# Patient Record
Sex: Female | Born: 1960 | Race: White | Hispanic: No | Smoking: Former smoker
Health system: Southern US, Community
[De-identification: ages and names within clinical notes are randomized; demographics above are authoritative.]

## PROBLEM LIST (undated history)

## (undated) DIAGNOSIS — R011 Cardiac murmur, unspecified: Secondary | ICD-10-CM

## (undated) DIAGNOSIS — G709 Myoneural disorder, unspecified: Secondary | ICD-10-CM

## (undated) DIAGNOSIS — M1990 Unspecified osteoarthritis, unspecified site: Secondary | ICD-10-CM

## (undated) HISTORY — DX: Unspecified osteoarthritis, unspecified site: M19.90

---

## 2000-04-22 ENCOUNTER — Encounter: Payer: Self-pay | Admitting: Family Medicine

## 2000-04-22 ENCOUNTER — Encounter: Admission: RE | Admit: 2000-04-22 | Discharge: 2000-04-22 | Payer: Self-pay | Admitting: Family Medicine

## 2000-05-01 ENCOUNTER — Encounter: Admission: RE | Admit: 2000-05-01 | Discharge: 2000-05-01 | Payer: Self-pay | Admitting: Family Medicine

## 2000-05-01 ENCOUNTER — Encounter: Payer: Self-pay | Admitting: Family Medicine

## 2000-05-14 ENCOUNTER — Ambulatory Visit (HOSPITAL_BASED_OUTPATIENT_CLINIC_OR_DEPARTMENT_OTHER): Admission: RE | Admit: 2000-05-14 | Discharge: 2000-05-14 | Payer: Self-pay | Admitting: General Surgery

## 2000-05-14 ENCOUNTER — Encounter: Payer: Self-pay | Admitting: General Surgery

## 2000-09-23 HISTORY — PX: OTHER SURGICAL HISTORY: SHX169

## 2001-07-13 ENCOUNTER — Encounter: Payer: Self-pay | Admitting: Family Medicine

## 2001-07-13 ENCOUNTER — Encounter: Admission: RE | Admit: 2001-07-13 | Discharge: 2001-07-13 | Payer: Self-pay | Admitting: Family Medicine

## 2003-04-04 ENCOUNTER — Encounter: Payer: Self-pay | Admitting: Family Medicine

## 2003-04-04 ENCOUNTER — Encounter: Admission: RE | Admit: 2003-04-04 | Discharge: 2003-04-04 | Payer: Self-pay | Admitting: Family Medicine

## 2018-08-18 DIAGNOSIS — B029 Zoster without complications: Secondary | ICD-10-CM | POA: Diagnosis not present

## 2020-11-24 LAB — COLOGUARD: COLOGUARD: NEGATIVE

## 2020-11-28 ENCOUNTER — Other Ambulatory Visit: Payer: Self-pay | Admitting: Internal Medicine

## 2020-11-28 DIAGNOSIS — Z1231 Encounter for screening mammogram for malignant neoplasm of breast: Secondary | ICD-10-CM

## 2021-01-23 ENCOUNTER — Ambulatory Visit
Admission: RE | Admit: 2021-01-23 | Discharge: 2021-01-23 | Disposition: A | Discharge status: Home / Self Care | Payer: Commercial Managed Care - PPO | Source: Ambulatory Visit | Attending: Internal Medicine | Admitting: Internal Medicine

## 2021-01-23 ENCOUNTER — Other Ambulatory Visit: Payer: Self-pay

## 2021-01-23 DIAGNOSIS — Z1231 Encounter for screening mammogram for malignant neoplasm of breast: Secondary | ICD-10-CM

## 2021-01-24 ENCOUNTER — Other Ambulatory Visit: Payer: Self-pay | Admitting: Internal Medicine

## 2021-01-24 DIAGNOSIS — R928 Other abnormal and inconclusive findings on diagnostic imaging of breast: Secondary | ICD-10-CM

## 2021-02-15 ENCOUNTER — Other Ambulatory Visit: Payer: Self-pay | Admitting: Internal Medicine

## 2021-02-15 ENCOUNTER — Ambulatory Visit
Admission: RE | Admit: 2021-02-15 | Discharge: 2021-02-15 | Disposition: A | Discharge status: Home / Self Care | Payer: Commercial Managed Care - PPO | Source: Ambulatory Visit | Attending: Internal Medicine | Admitting: Internal Medicine

## 2021-02-15 ENCOUNTER — Other Ambulatory Visit: Payer: Self-pay

## 2021-02-15 DIAGNOSIS — R928 Other abnormal and inconclusive findings on diagnostic imaging of breast: Secondary | ICD-10-CM

## 2021-02-20 ENCOUNTER — Encounter (HOSPITAL_COMMUNITY): Payer: Self-pay | Admitting: Emergency Medicine

## 2021-02-20 ENCOUNTER — Ambulatory Visit (HOSPITAL_COMMUNITY)
Admission: EM | Admit: 2021-02-20 | Discharge: 2021-02-20 | Disposition: A | Discharge status: Home / Self Care | Payer: Commercial Managed Care - PPO | Attending: Family Medicine | Admitting: Family Medicine

## 2021-02-20 ENCOUNTER — Other Ambulatory Visit: Payer: Self-pay

## 2021-02-20 DIAGNOSIS — Z20822 Contact with and (suspected) exposure to covid-19: Secondary | ICD-10-CM | POA: Diagnosis present

## 2021-02-20 DIAGNOSIS — R509 Fever, unspecified: Secondary | ICD-10-CM | POA: Diagnosis present

## 2021-02-20 DIAGNOSIS — R519 Headache, unspecified: Secondary | ICD-10-CM | POA: Diagnosis present

## 2021-02-20 NOTE — Discharge Instructions (Signed)
You have been tested for COVID-19 today. °If your test returns positive, you will receive a phone call from [facility] regarding your results. °Negative test results are not called. °Both positive and negative results area always visible on MyChart. °If you do not have a MyChart account, sign up instructions are provided in your discharge papers. °Please do not hesitate to contact us should you have questions or concerns. ° °

## 2021-02-20 NOTE — ED Triage Notes (Signed)
Pt presents with headache and fever that started this am after exposure.   States taking tylenol and OTC flu medication.

## 2021-02-21 LAB — SARS CORONAVIRUS 2 (TAT 6-24 HRS): SARS Coronavirus 2: POSITIVE — AB

## 2021-02-21 NOTE — ED Provider Notes (Signed)
  [facility]   [id number] 02/20/21 Arrival Time: 1819  ASSESSMENT & PLAN:  1. Acute nonintractable headache, unspecified headache type   2. Subjective fever   3. Exposure to COVID-19 virus     COVID-19 testing sent. OTC symptom care as needed.  No orders of the defined types were placed in this encounter.    Follow-up Information    Tannenbaum, [place], MD.   Specialty: Internal Medicine Why: As needed. Contact information: [address] [place] [address] [phone]               Reviewed expectations re: course of current medical issues. Questions answered. Outlined signs and symptoms indicating need for more acute intervention. Understanding verbalized. After Visit Summary given.   SUBJECTIVE: History from: patient. KEYSHA DAMEWOOD is a 60 y.o. female who presents with worries regarding COVID-19. Known COVID-19 contact: reports recent exposure. Recent travel: none. Reports: HA with subj fever; today. Denies: difficulty breathing. Normal PO intake without n/v/d.    OBJECTIVE:  Vitals:   02/20/21 1911  BP: 128/60  Pulse: 86  Resp: 15  Temp: 98.7 F (37.1 C)  TempSrc: Oral  SpO2: 98%    General appearance: alert; no distress Eyes: PERRLA; EOMI; conjunctiva normal HENT: [place]; AT; without nasal congestion Neck: supple  Lungs: speaks full sentences without difficulty; unlabored Extremities: no edema Skin: warm and dry Neurologic: normal gait Psychological: alert and cooperative; normal mood and affect   Allergies  Allergen Reactions  . Penicillin G Anaphylaxis  . Aspirin Other (See Comments)  . Prednisone Other (See Comments)    History reviewed. No pertinent past medical history. Social History   Socioeconomic History  . Marital status: Divorced    Spouse name: Not on file  . Number of children: Not on file  . Years of education: Not on file  . Highest education level: Not on file  Occupational History  . Not  on file  Tobacco Use  . Smoking status: Never Smoker  . Smokeless tobacco: Never Used  Substance and Sexual Activity  . Alcohol use: Never  . Drug use: Not on file  . Sexual activity: Not on file  Other Topics Concern  . Not on file  Social History Narrative  . Not on file   Social Determinants of Health   Financial Resource Strain: Not on file  Food Insecurity: Not on file  Transportation Needs: Not on file  Physical Activity: Not on file  Stress: Not on file  Social Connections: Not on file  Intimate Partner Violence: Not on file   History reviewed. No pertinent family history. History reviewed. No pertinent surgical history.   Mardella Layman, MD 02/21/21 1030

## 2021-05-21 ENCOUNTER — Other Ambulatory Visit: Payer: Self-pay | Admitting: Internal Medicine

## 2021-05-21 ENCOUNTER — Ambulatory Visit
Admission: RE | Admit: 2021-05-21 | Discharge: 2021-05-21 | Disposition: A | Discharge status: Home / Self Care | Payer: Commercial Managed Care - PPO | Source: Ambulatory Visit | Attending: Internal Medicine | Admitting: Internal Medicine

## 2021-05-21 DIAGNOSIS — M6281 Muscle weakness (generalized): Secondary | ICD-10-CM

## 2021-08-15 ENCOUNTER — Ambulatory Visit (INDEPENDENT_AMBULATORY_CARE_PROVIDER_SITE_OTHER): Payer: Commercial Managed Care - PPO | Admitting: Neurology

## 2021-08-15 ENCOUNTER — Encounter: Payer: Self-pay | Admitting: Neurology

## 2021-08-15 VITALS — BP 127/75 | HR 75 | Ht 63.0 in | Wt 153.0 lb

## 2021-08-15 DIAGNOSIS — M5416 Radiculopathy, lumbar region: Secondary | ICD-10-CM | POA: Diagnosis not present

## 2021-08-15 DIAGNOSIS — M21371 Foot drop, right foot: Secondary | ICD-10-CM | POA: Diagnosis not present

## 2021-08-15 NOTE — Patient Instructions (Signed)
Nerve conduction study  Referral to physical therapy  Return in 6 months

## 2021-08-15 NOTE — Progress Notes (Signed)
[facility]  PATIENT: Shawna Suarez DOB: 22-Apr-1961  REQUESTING CLINICIAN: Estill Bamberg, MD HISTORY FROM: Patient  REASON FOR VISIT: Right leg pain and right foot drop    HISTORICAL  CHIEF COMPLAINT:  Chief Complaint  Patient presents with   New Patient (Initial Visit)    Rm 12. Alone. NP c/o right leg weakness and shooting pain down back and right leg. C/o numbness in the bottom of right foot. Pt states she is having balance problems.     HISTORY OF PRESENT ILLNESS:  This is a 60 year old woman with no reported past medical history who is presenting with complaint of right lower extremity pain, described as shooting pain, and right foot weakness.  Patient said the symptoms started since last September after receiving the second dose of COVID.  Since that she has been having intermittent pain.  She followed with orthopedist, had a lumbar MRI which showed no abnormality other than degenerative disc disease.  She is able to ambulate but stated she has to be very careful, denies any falls.  She reported the pain is located right at the hip joint and radiates behind right leg all the way down to the foot, there is also report of numbness on the bottom of the foot.  Denies back pain.    OTHER MEDICAL CONDITIONS: None    REVIEW OF SYSTEMS: Full 14 system review of systems performed and negative with exception of: noted in the HPI   ALLERGIES: Allergies  Allergen Reactions   Penicillin G Anaphylaxis   Aspirin Other (See Comments)   Prednisone Other (See Comments)   Sulfa Antibiotics Hives    HOME MEDICATIONS: Outpatient Medications Prior to Visit  Medication Sig Dispense Refill   acetaminophen (TYLENOL) 325 MG tablet Take 650 mg by mouth every 6 (six) hours as needed.     No facility-administered medications prior to visit.    PAST MEDICAL HISTORY: Past Medical History:  Diagnosis Date   Arthritis     PAST SURGICAL HISTORY: Past Surgical  History:  Procedure Laterality Date   right breast biopsy  2002    FAMILY HISTORY: Family History  Problem Relation Age of Onset   Lung cancer Mother    Cervical cancer Mother    Throat cancer Father    Prostate cancer Father     SOCIAL HISTORY: Social History   Socioeconomic History   Marital status: Divorced    Spouse name: Not on file   Number of children: Not on file   Years of education: Not on file   Highest education level: Not on file  Occupational History   Not on file  Tobacco Use   Smoking status: Former    Years: 5.00    Types: Cigarettes    Quit date: 1978    Years since quitting: 44.9   Smokeless tobacco: Never  Substance and Sexual Activity   Alcohol use: Never   Drug use: Not on file   Sexual activity: Not on file  Other Topics Concern   Not on file  Social History Narrative   Not on file   Social Determinants of Health   Financial Resource Strain: Not on file  Food Insecurity: Not on file  Transportation Needs: Not on file  Physical Activity: Not on file  Stress: Not on file  Social Connections: Not on file  Intimate Partner Violence: Not on file    PHYSICAL EXAM  GENERAL EXAM/CONSTITUTIONAL: Vitals:  Vitals:   08/15/21 1456  BP: 127/75  Pulse: 75  Weight: 153 lb (69.4 kg)  Height: 5\' 3"  (1.6 m)   Body mass index is 27.1 kg/m. Wt Readings from Last 3 Encounters:  08/15/21 153 lb (69.4 kg)   Patient is in no distress; well developed, nourished and groomed; neck is supple  CARDIOVASCULAR: Examination of carotid arteries is normal; no carotid bruits Regular rate and rhythm, no murmurs Examination of peripheral vascular system by observation and palpation is normal  EYES: Pupils round and reactive to light, Visual fields full to confrontation, Extraocular movements intacts,   MUSCULOSKELETAL: Gait, strength, tone, movements noted in Neurologic exam below  NEUROLOGIC: MENTAL STATUS:  No flowsheet data found. awake, alert,  oriented to person, place and time recent and remote memory intact normal attention and concentration language fluent, comprehension intact, naming intact fund of knowledge appropriate  CRANIAL NERVE:  2nd, 3rd, 4th, 6th - pupils equal and reactive to light, visual fields full to confrontation, extraocular muscles intact, no nystagmus 5th - facial sensation symmetric 7th - facial strength symmetric 8th - hearing intact 9th - palate elevates symmetrically, uvula midline 11th - shoulder shrug symmetric 12th - tongue protrusion midline  MOTOR:  normal bulk and tone, full strength in the BUE, BLE. There is decrease strength about 4/5 for right foot dorsiflexion and inversion when compared to the left.   SENSORY:  normal and symmetric to light touch, pinprick, and vibration  COORDINATION:  finger-nose-finger, fine finger movements normal  REFLEXES:  deep tendon reflexes present and symmetric  GAIT/STATION:  Antalgic gait      DIAGNOSTIC DATA (LABS, IMAGING, TESTING) - I reviewed patient records, labs, notes, testing and imaging myself where available.  No results found for: WBC, HGB, HCT, MCV, PLT No results found for: NA, K, CL, CO2, GLUCOSE, BUN, CREATININE, CALCIUM, PROT, ALBUMIN, AST, ALT, ALKPHOS, BILITOT, GFRNONAA, GFRAA No results found for: CHOL, HDL, LDLCALC, LDLDIRECT, TRIG, CHOLHDL No results found for: 08/17/21 No results found for: VITAMINB12 No results found for: TSH  MRI Lumbar spine 1.  Mild multilevel lumbar spondylosis with mild to moderate right-sided foraminal stenosis at L3-L4 and L4-L5. 2.  Mild bilateral subarticular recess stenosis at L2-L3, L3 on 4 and L4-5.  No high-grade canal stenosis.    ASSESSMENT AND PLAN  60 y.o. year old female with no reported past medical history who is presenting with complaint of right lower extremity pain, radiating pain down the right leg, right foot drop and right foot numbness.  Patient did have an MRI of lumbar  spine showing mild to moderate foraminal stenosis but no nerve compression.  At this point the neck step is to order a nerve conduction study to look at the integrity of the nerves.  If the nerve conduction study is unrevealing, another consideration is right hip joint arthritis that is causing the pain.  I will also send her to physical therapy for management and treatment of the right foot drop.  I will contact her after completion of nerve studies otherwise I will see her in 6 months for follow-up.  I also offered her pain medication for which she kindly declined.   1. Lumbar radiculopathy   2. Right foot drop      PLAN: Nerve conduction study  Referral to physical therapy  Return in 6 months   Orders Placed This Encounter  Procedures   Ambulatory referral to Physical Therapy   NCV with EMG(electromyography)    No orders of the defined types were placed in this encounter.  Return in about 6 months (around 02/12/2022).    Windell Norfolk, MD 08/15/2021, 3:41 PM  [facility] [address] [place], [place] [address] [phone]

## 2021-08-21 ENCOUNTER — Other Ambulatory Visit: Payer: Commercial Managed Care - PPO

## 2021-10-04 ENCOUNTER — Ambulatory Visit (INDEPENDENT_AMBULATORY_CARE_PROVIDER_SITE_OTHER): Payer: Commercial Managed Care - PPO | Admitting: Neurology

## 2021-10-04 DIAGNOSIS — M5416 Radiculopathy, lumbar region: Secondary | ICD-10-CM

## 2021-10-04 DIAGNOSIS — G5731 Lesion of lateral popliteal nerve, right lower limb: Secondary | ICD-10-CM

## 2021-10-04 DIAGNOSIS — M21371 Foot drop, right foot: Secondary | ICD-10-CM

## 2021-10-04 NOTE — Progress Notes (Addendum)
History: Patient is here for EMG/NCS. Patient reports numbness. She has a lot of low back pain. The pain can radiate from the right low back or the hip all the way down to the foot. She also has painful knees. Exam shows weakness in inversion,also possibly mild eversion,dorsiflexion but intact AJs on right.  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  I discussed briefly with patient, that this is likely a peroneal nerve that is rubbing against the fibular head and that can cause the foot drop and numbness in the peroneal distribution, she asked me what are some of the things they do about it and I discussed briefly conservative measures and possibly orthopaedic referral but I defer to Dr. Teresa Coombs.  But I will forward to Dr. Olegario Messier and he will have to have another appointment or phone call with the patient to further discuss.  Also considering her pain does come from the low back, may consider CT lumbar myelogram just to ensure that there is not any L5 radiculopathy concomitantly.   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  This included previsit chart review, lab review, study review, order entry, electronic health record documentation, patient education on the different diagnostic and therapeutic options, counseling and coordination of care, risks and benefits of management, compliance, or risk factor reduction. This does not include time spent on EMG/NCS.   Cc: Dr. Teresa Coombs

## 2021-10-08 ENCOUNTER — Ambulatory Visit: Payer: Commercial Managed Care - PPO | Attending: Neurology

## 2021-10-08 NOTE — Therapy (Deleted)
OUTPATIENT PHYSICAL THERAPY THORACOLUMBAR EVALUATION   Patient Name: Shawna Suarez MRN: [id number] DOB:Nov 23, 1960, 61 y.o., female Today's Date: 10/08/2021    Past Medical History:  Diagnosis Date   Arthritis    Past Surgical History:  Procedure Laterality Date   right breast biopsy  2002   There are no problems to display for this patient.   PCP: Tannenbaum, [place], MD  REFERRING PROVIDER: Windell Norfolk, MD  REFERRING DIAG:  M54.16 (ICD-10-CM) - Lumbar radiculopathy M21.371 (ICD-10-CM) - Right foot drop  THERAPY DIAG:  No diagnosis found.  ONSET DATE: ***  SUBJECTIVE:                                                                                                                                                                                           SUBJECTIVE STATEMENT: *** PERTINENT HISTORY:  ***  PAIN:  Are you having pain? {yes/no:20286} NPRS scale: ***/10 Pain location: *** Pain orientation: {Pain Orientation:25161}  PAIN TYPE: {type:313116} Pain description: {PAIN DESCRIPTION:21022940}  Aggravating factors: *** Relieving factors: ***  PRECAUTIONS: {Therapy precautions:24002}  WEIGHT BEARING RESTRICTIONS {Yes ***/No:24003}  FALLS:  Has patient fallen in last 6 months? {yes/no:20286}, Number of falls: ***  LIVING ENVIRONMENT: Lives with: {[facility] lives with:25569::"lives with their family"} Lives in: {Lives in:25570} Stairs: {yes/no:20286}; {Stairs:24000} Has following equipment at home: {Assistive devices:23999}  OCCUPATION: ***  PLOF: {PLOF:24004}  PATIENT GOALS ***   OBJECTIVE:   DIAGNOSTIC FINDINGS:  CLINICAL DATA:  Muscle weakness for 2 months in both legs.   EXAM: LUMBAR SPINE - COMPLETE 4+ VIEW   COMPARISON:  None   FINDINGS: Five lumbar type vertebral bodies. Vertebral body heights are maintained.   Mild disc space narrowing noted at L2-3 and L3-4.   Moderate disc space narrowing at L5-S1.   Facet arthropathy in the  lower lumbar spine greatest at L4-5 and L5-S1.   No sign of acute bony abnormality.   Incidental note is made of some degenerative change in the lower thoracic spine.   IMPRESSION: Multilevel degenerative disc disease greatest at L5-S1.   Facet arthropathy greatest at L4-5 and L5-S1.    PATIENT SURVEYS:  {rehab surveys:24030}  SCREENING FOR RED FLAGS: Bowel or bladder incontinence: {Yes/No:304960894} Spinal tumors: {Yes/No:304960894} Cauda equina syndrome: {Yes/No:304960894} Compression fracture: {Yes/No:304960894} Abdominal aneurysm: {Yes/No:304960894}  COGNITION:  Overall cognitive status: {cognition:24006}     SENSATION:  Light touch: {intact/deficits:24005}  Stereognosis: {intact/deficits:24005}  Hot/Cold: {intact/deficits:24005}  Proprioception: {intact/deficits:24005}  MUSCLE LENGTH: Hamstrings: Right *** deg; Left *** deg Thomas test: Right *** deg; Left *** deg  POSTURE:  ***  PALPATION: ***  LUMBARAROM/PROM  A/PROM A/PROM  10/08/2021  Flexion  Extension   Right lateral flexion   Left lateral flexion   Right rotation   Left rotation    (Blank rows = not tested)  LE AROM/PROM:  A/PROM Right 10/08/2021 Left 10/08/2021  Hip flexion    Hip extension    Hip abduction    Hip adduction    Hip internal rotation    Hip external rotation    Knee flexion    Knee extension    Ankle dorsiflexion    Ankle plantarflexion    Ankle inversion    Ankle eversion     (Blank rows = not tested)  LE MMT:  MMT Right 10/08/2021 Left 10/08/2021  Hip flexion    Hip extension    Hip abduction    Hip adduction    Hip internal rotation    Hip external rotation    Knee flexion    Knee extension    Ankle dorsiflexion    Ankle plantarflexion    Ankle inversion    Ankle eversion     (Blank rows = not tested)  LUMBAR SPECIAL TESTS:  {lumbar special test:25242}  FUNCTIONAL TESTS:  {Functional tests:24029}  GAIT: Distance walked: *** Assistive device  utilized: {Assistive devices:23999} Level of assistance: {Levels of assistance:24026} Comments: ***    TODAY'S TREATMENT  ***   PATIENT EDUCATION:  Education details: *** Person educated: {Person educated:25204} Education method: {Education Method:25205} Education comprehension: {Education Comprehension:25206}   HOME EXERCISE PROGRAM: ***  ASSESSMENT:  CLINICAL IMPRESSION: Patient is a *** y.o. *** who was seen today for physical therapy evaluation and treatment for ***. Objective impairments include {opptimpairments:25111}. These impairments are limiting patient from {activity limitations:25113}. Personal factors including {Personal factors:25162} are also affecting patient's functional outcome. Patient will benefit from skilled PT to address above impairments and improve overall function.  REHAB POTENTIAL: {rehabpotential:25112}  CLINICAL DECISION MAKING: {clinical decision making:25114}  EVALUATION COMPLEXITY: {Evaluation complexity:25115}   GOALS: Goals reviewed with patient? {yes/no:20286}  SHORT TERM GOALS:  STG Name Target Date Goal status  1 *** Baseline:  {follow up:25551} {GOALSTATUS:25110}  2 *** Baseline:  {follow up:25551} {GOALSTATUS:25110}  3 *** Baseline: {follow up:25551} {GOALSTATUS:25110}  4 *** Baseline: {follow up:25551} {GOALSTATUS:25110}  5 *** Baseline: {follow up:25551} {GOALSTATUS:25110}  6 *** Baseline: {follow up:25551} {GOALSTATUS:25110}  7 *** Baseline: {follow up:25551} {GOALSTATUS:25110}   LONG TERM GOALS:   LTG Name Target Date Goal status  1 *** Baseline: {follow up:25551} {GOALSTATUS:25110}  2 *** Baseline: {follow up:25551} {GOALSTATUS:25110}  3 *** Baseline: {follow up:25551} {GOALSTATUS:25110}  4 *** Baseline: {follow up:25551} {GOALSTATUS:25110}  5 *** Baseline: {follow up:25551} {GOALSTATUS:25110}  6 *** Baseline: {follow up:25551} {GOALSTATUS:25110}  7 *** Baseline: {follow up:25551} {GOALSTATUS:25110}    PLAN: PT FREQUENCY: {rehab frequency:25116}  PT DURATION: {rehab duration:25117}  PLANNED INTERVENTIONS: {rehab planned interventions:25118::"Therapeutic exercises","Therapeutic activity","Neuro Muscular re-education","Balance training","Gait training","Patient/Family education","Joint mobilization"}  PLAN FOR NEXT SESSION: Eloy End 10/08/2021, 12:21 PM

## 2021-10-09 DIAGNOSIS — G5731 Lesion of lateral popliteal nerve, right lower limb: Secondary | ICD-10-CM | POA: Insufficient documentation

## 2021-10-11 NOTE — Procedures (Signed)
Full Name: Shawna Suarez Gender: Female MRN #: [id number] Date of Birth: 12-18-1960    Visit Date: 10/04/2021 07:45 Age: 61 Years Examining Physician: Naomie Dean, MD  Referring Physician: Windell Norfolk, MD Height: 5 feet 3 inch 153lbs Patient History: Right leg distal weakness and sensory changes  Summary: Nerve conduction studies were conducted on the bilateral lower extremities.  EMG needle exam was performed on the right lower extremity.  The right peroneal motor nerve (recording at the right tibialis anterior muscle) showed no response (popliteal fossa -fibular head). All remaining nerves (as indicated in the following tables) were within normal limits.  All muscles (as indicated in the following tables) were within normal limits.      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  ------------------------------- Naomie Dean M.D.  [facility] [address], [address] [place], [place] [address] Tel: [phone] Fax: [phone]  Verbal informed consent was obtained from the patient, patient was informed of potential risk of procedure, including bruising, bleeding, hematoma formation, infection, muscle weakness, muscle pain, numbness, among others.        MNC    Nerve / Sites Muscle Latency Ref. Amplitude Ref. Rel Amp Segments Distance Velocity Ref. Area    ms ms mV mV %  cm m/s m/s mVms  R Peroneal - EDB     Ankle EDB 5.0 ?6.5 5.0 ?2.0 100 Ankle - EDB 9   19.2     Fib head EDB 10.9  4.6  92.3 Fib head - Ankle 26 44 ?44 19.2     Pop fossa EDB 13.1  4.4  95.2 Pop fossa - Fib head 10 44 ?44 18.7         Pop fossa - Ankle      L Peroneal - EDB     Ankle EDB 5.1 ?6.5 4.6 ?2.0 100 Ankle - EDB 9   21.2     Fib head EDB 11.1  4.1  89.9 Fib head - Ankle 26 44 ?44 21.3     Pop fossa EDB 13.4  3.9  93.8 Pop fossa - Fib head 10 44 ?44 19.4         Pop fossa - Ankle      R Tibial - AH      Ankle AH 4.1 ?5.8 4.3 ?4.0 100 Ankle - AH 9   20.6     Pop fossa AH 13.3  3.3  77 Pop fossa - Ankle 38 41 ?41 20.1  L Tibial - AH     Ankle AH 4.2 ?5.8 4.4 ?4.0 100 Ankle - AH 9   20.8     Pop fossa AH 13.3  4.7  107 Pop fossa - Ankle 37 41 ?41 18.9  R Peroneal - Tib Ant     Fib Head Tib Ant 2.3 ?4.7 4.6 ?3.0 100 Fib Head - Tib Ant 10   41.1     Pop fossa Tib Ant NR  NR  NR Pop fossa - Fib Head 10 NR ?44 NR  L Peroneal - Tib Ant     Fib Head Tib Ant 2.8 ?4.7 4.3 ?3.0 100 Fib Head - Tib Ant 10   38.4     Pop fossa Tib Ant 4.9  4.3  101 Pop fossa - Fib Head 10 49 ?44 37.6                 SNC    Nerve /  Sites Rec. Site Peak Lat Ref.  Amp Ref. Segments Distance    ms ms V V  cm  R Sural - Ankle (Calf)     Calf Ankle 4.2 ?4.4 18 ?6 Calf - Ankle 14  L Sural - Ankle (Calf)     Calf Ankle 3.4 ?4.4 19 ?6 Calf - Ankle 14  R Superficial peroneal - Ankle     Lat leg Ankle 3.6 ?4.4 10 ?6 Lat leg - Ankle 14  L Superficial peroneal - Ankle     Lat leg Ankle 3.3 ?4.4 13 ?6 Lat leg - Ankle 14             F  Wave    Nerve F Lat Ref.   ms ms  R Tibial - AH 55.9 ?56.0  L Tibial - AH 55.9 ?56.0         EMG Summary Table    Spontaneous MUAP Recruitment  Muscle IA Fib PSW Fasc Other Amp Dur. Poly Pattern  R. Vastus medialis Normal None None None _______ Normal Normal Normal Normal  R. Tibialis anterior Normal None None None _______ Normal Normal Normal Normal  R. Gastrocnemius (Medial head) Normal None None None _______ Normal Normal Normal Normal  R. Peroneus longus Normal None None None _______ Normal Normal Normal Normal  R. Extensor hallucis longus Normal None None None _______ Normal Normal Normal Normal  R. Biceps femoris (long head) Normal None None None _______ Normal Normal Normal Normal  R. Gluteus maximus Normal None None None _______ Normal Normal Normal Normal  R. Gluteus medius Normal None None None _______ Normal Normal Normal Normal  R. Lumbar paraspinals (low) Normal None None  None _______ Normal Normal Normal Normal  R. Biceps femoris (short head) Normal None None None _______ Normal Normal Normal Normal

## 2021-10-11 NOTE — Progress Notes (Signed)
Full Name: Shawna Suarez Gender: Female MRN #: [id number] Date of Birth: 12-18-1960    Visit Date: 10/04/2021 07:45 Age: 61 Years Examining Physician: Naomie Dean, MD  Referring Physician: Windell Norfolk, MD Height: 5 feet 3 inch 153lbs Patient History: Right leg distal weakness and sensory changes  Summary: Nerve conduction studies were conducted on the bilateral lower extremities.  EMG needle exam was performed on the right lower extremity.  The right peroneal motor nerve (recording at the right tibialis anterior muscle) showed no response (popliteal fossa -fibular head). All remaining nerves (as indicated in the following tables) were within normal limits.  All muscles (as indicated in the following tables) were within normal limits.      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  ------------------------------- Naomie Dean M.D.  [facility] [address], [address] [place], [place] [address] Tel: [phone] Fax: [phone]  Verbal informed consent was obtained from the patient, patient was informed of potential risk of procedure, including bruising, bleeding, hematoma formation, infection, muscle weakness, muscle pain, numbness, among others.        MNC    Nerve / Sites Muscle Latency Ref. Amplitude Ref. Rel Amp Segments Distance Velocity Ref. Area    ms ms mV mV %  cm m/s m/s mVms  R Peroneal - EDB     Ankle EDB 5.0 ?6.5 5.0 ?2.0 100 Ankle - EDB 9   19.2     Fib head EDB 10.9  4.6  92.3 Fib head - Ankle 26 44 ?44 19.2     Pop fossa EDB 13.1  4.4  95.2 Pop fossa - Fib head 10 44 ?44 18.7         Pop fossa - Ankle      L Peroneal - EDB     Ankle EDB 5.1 ?6.5 4.6 ?2.0 100 Ankle - EDB 9   21.2     Fib head EDB 11.1  4.1  89.9 Fib head - Ankle 26 44 ?44 21.3     Pop fossa EDB 13.4  3.9  93.8 Pop fossa - Fib head 10 44 ?44 19.4         Pop fossa - Ankle      R Tibial - AH      Ankle AH 4.1 ?5.8 4.3 ?4.0 100 Ankle - AH 9   20.6     Pop fossa AH 13.3  3.3  77 Pop fossa - Ankle 38 41 ?41 20.1  L Tibial - AH     Ankle AH 4.2 ?5.8 4.4 ?4.0 100 Ankle - AH 9   20.8     Pop fossa AH 13.3  4.7  107 Pop fossa - Ankle 37 41 ?41 18.9  R Peroneal - Tib Ant     Fib Head Tib Ant 2.3 ?4.7 4.6 ?3.0 100 Fib Head - Tib Ant 10   41.1     Pop fossa Tib Ant NR  NR  NR Pop fossa - Fib Head 10 NR ?44 NR  L Peroneal - Tib Ant     Fib Head Tib Ant 2.8 ?4.7 4.3 ?3.0 100 Fib Head - Tib Ant 10   38.4     Pop fossa Tib Ant 4.9  4.3  101 Pop fossa - Fib Head 10 49 ?44 37.6                 SNC    Nerve /  Sites Rec. Site Peak Lat Ref.  Amp Ref. Segments Distance  °  ms ms µV µV  cm  °R Sural - Ankle (Calf)  °   Calf Ankle 4.2 ?4.4 18 ?6 Calf - Ankle 14  °L Sural - Ankle (Calf)  °   Calf Ankle 3.4 ?4.4 19 ?6 Calf - Ankle 14  °R Superficial peroneal - Ankle  °   Lat leg Ankle 3.6 ?4.4 10 ?6 Lat leg - Ankle 14  °L Superficial peroneal - Ankle  °   Lat leg Ankle 3.3 ?4.4 13 ?6 Lat leg - Ankle 14  °           °F  Wave °   °Nerve F Lat Ref.  ° ms ms  °R Tibial - AH 55.9 ?56.0  °L Tibial - AH 55.9 ?56.0  °       °EMG Summary Table   ° Spontaneous MUAP Recruitment  °Muscle IA Fib PSW Fasc Other Amp Dur. Poly Pattern  °R. Vastus medialis Normal None None None _______ Normal Normal Normal Normal  °R. Tibialis anterior Normal None None None _______ Normal Normal Normal Normal  °R. Gastrocnemius (Medial head) Normal None None None _______ Normal Normal Normal Normal  °R. Peroneus longus Normal None None None _______ Normal Normal Normal Normal  °R. Extensor hallucis longus Normal None None None _______ Normal Normal Normal Normal  °R. Biceps femoris (long head) Normal None None None _______ Normal Normal Normal Normal  °R. Gluteus maximus Normal None None None _______ Normal Normal Normal Normal  °R. Gluteus medius Normal None None None _______ Normal Normal Normal Normal  °R. Lumbar paraspinals (low) Normal None None  None _______ Normal Normal Normal Normal  °R. Biceps femoris (short head) Normal None None None _______ Normal Normal Normal Normal  ° °

## 2021-10-31 ENCOUNTER — Telehealth: Payer: Self-pay | Admitting: Neurology

## 2021-10-31 NOTE — Telephone Encounter (Signed)
Pt is asking for a call because she has not heard anything about what will be done about her nerve damage.  Pt is hardly able to walk, she is worsening.

## 2021-10-31 NOTE — Telephone Encounter (Signed)
I spoke to the patient. She was unaware she missed her PT evaluation. She is going to call their office to reschedule.   [facility]: [phone] _____________________________________  She would like a call from Dr. April Manson to discuss her NCV/EMG findings.

## 2021-10-31 NOTE — Telephone Encounter (Addendum)
Left message for a return call.  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 ____________________________________ Per Epic, patient no showed her evaluation appt with PT on 10/08/21.  Next pending appt here 01/24/22.

## 2021-10-31 NOTE — Telephone Encounter (Signed)
Left second message on voice mail.

## 2021-11-05 NOTE — Therapy (Incomplete)
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  ONSET DATE: ***  SUBJECTIVE:                                                                                                                                                                                           SUBJECTIVE STATEMENT: *** PERTINENT HISTORY:  None report of arthritis  PAIN:  Are you having pain? {yes/no:20286} NPRS scale: ***/10 Pain location: *** Pain orientation: {Pain Orientation:25161}  PAIN TYPE: {type:313116} Pain description: {PAIN DESCRIPTION:21022940}  Aggravating factors: *** Relieving factors: ***  PRECAUTIONS: {Therapy precautions:24002}  WEIGHT BEARING RESTRICTIONS No  FALLS:  Has patient fallen in last 6 months? 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 05-21-21 MRI IMPRESSION: Multilevel degenerative disc disease greatest at L5-S1.   Facet arthropathy greatest at L4-5 and L5-S1.  PATIENT SURVEYS:   {rehab surveys:24030}  SCREENING FOR RED FLAGS: Bowel or bladder incontinence: {Yes/No:304960894} Spinal tumors: {Yes/No:304960894} Cauda equina syndrome: {Yes/No:304960894} Compression fracture: {Yes/No:304960894} Abdominal aneurysm: {Yes/No:304960894}  COGNITION:  Overall cognitive status: {cognition:24006}     SENSATION:  Light touch: {intact/deficits:24005}  Stereognosis: {intact/deficits:24005}  Hot/Cold: {intact/deficits:24005}  Proprioception: {intact/deficits:24005}  MUSCLE LENGTH: Hamstrings: Right *** deg; Left *** deg Thomas test: Right *** deg; Left *** deg  POSTURE:  ***  PALPATION: ***  LUMBARAROM/PROM  A/PROM A/PROM  11/05/2021  Flexion   Extension   Right lateral flexion   Left lateral flexion   Right rotation   Left rotation    (Blank rows = not tested)  LE AROM/PROM:  A/PROM Right 11/05/2021 Left 11/05/2021  Hip flexion    Hip extension    Hip abduction    Hip adduction    Hip internal rotation    Hip external rotation  Knee flexion    Knee extension    Ankle dorsiflexion    Ankle plantarflexion    Ankle inversion    Ankle eversion     (Blank rows = not tested)  LE MMT:  MMT Right 11/05/2021 Left 11/05/2021  Hip flexion    Hip extension    Hip abduction    Hip adduction    Hip internal rotation    Hip external rotation    Knee flexion    Knee extension    Ankle dorsiflexion    Ankle plantarflexion    Ankle inversion    Ankle eversion     (Blank rows = not tested)  LUMBAR SPECIAL TESTS:  {lumbar special test:25242}  FUNCTIONAL TESTS:  {Functional tests:24029}  GAIT: Distance walked: *** Assistive device utilized: {Assistive devices:23999} Level of assistance: {Levels of assistance:24026} Comments: ***    TODAY'S TREATMENT  ***   PATIENT EDUCATION:  Education details: *** Person educated: {Person educated:25204} Education method: {Education Method:25205} Education comprehension: {Education  Comprehension:25206}   HOME EXERCISE PROGRAM: ***  ASSESSMENT:  CLINICAL IMPRESSION: Patient is a *** y.o. *** who was seen today for physical therapy evaluation and treatment for ***. Objective impairments include {opptimpairments:25111}. These impairments are limiting patient from {activity limitations:25113}. Personal factors including {Personal factors:25162} are also affecting patient's functional outcome. Patient will benefit from skilled PT to address above impairments and improve overall function.  REHAB POTENTIAL: {rehabpotential:25112}  CLINICAL DECISION MAKING: {clinical decision making:25114}  EVALUATION COMPLEXITY: {Evaluation complexity:25115}   GOALS: Goals reviewed with patient? {yes/no:20286}  SHORT TERM GOALS:  STG Name Target Date Goal status  1 *** Baseline:  {follow up:25551} {GOALSTATUS:25110}  2 *** Baseline:  {follow up:25551} {GOALSTATUS:25110}  3 *** Baseline: {follow up:25551} {GOALSTATUS:25110}  4 *** Baseline: {follow up:25551} {GOALSTATUS:25110}  5 *** Baseline: {follow up:25551} {GOALSTATUS:25110}  6 *** Baseline: {follow up:25551} {GOALSTATUS:25110}  7 *** Baseline: {follow up:25551} {GOALSTATUS:25110}   LONG TERM GOALS:   LTG Name Target Date Goal status  1 *** Baseline: {follow up:25551} {GOALSTATUS:25110}  2 *** Baseline: {follow up:25551} {GOALSTATUS:25110}  3 *** Baseline: {follow up:25551} {GOALSTATUS:25110}  4 *** Baseline: {follow up:25551} {GOALSTATUS:25110}  5 *** Baseline: {follow up:25551} {GOALSTATUS:25110}  6 *** Baseline: {follow up:25551} {GOALSTATUS:25110}  7 *** Baseline: {follow up:25551} {GOALSTATUS:25110}   PLAN: PT FREQUENCY: {rehab frequency:25116}  PT DURATION: {rehab duration:25117}  PLANNED INTERVENTIONS: {rehab planned interventions:25118::"Therapeutic exercises","Therapeutic activity","Neuro Muscular re-education","Balance training","Gait training","Patient/Family education","Joint  mobilization"}  PLAN FOR NEXT SESSION: ***   ***

## 2021-11-05 NOTE — Telephone Encounter (Signed)
Left a message for patient, will call back later.

## 2021-11-06 ENCOUNTER — Other Ambulatory Visit: Payer: Self-pay | Admitting: Neurology

## 2021-11-06 ENCOUNTER — Other Ambulatory Visit: Payer: Self-pay

## 2021-11-06 ENCOUNTER — Ambulatory Visit: Payer: Commercial Managed Care - PPO | Admitting: Physical Therapy

## 2021-11-06 ENCOUNTER — Telehealth: Payer: Self-pay | Admitting: Neurology

## 2021-11-06 DIAGNOSIS — M21371 Foot drop, right foot: Secondary | ICD-10-CM

## 2021-11-06 NOTE — Telephone Encounter (Signed)
Dr. Teresa Coombs - Please call the patient back today to discuss the results for the Nerve Cond study so she can get a referral for PT for leg if needed. Her cell is [phone] She walked into the lobby and the notes read you called her yesterday but she advised she did not receive a call or message. Thank you

## 2021-11-06 NOTE — Telephone Encounter (Signed)
I will come and talk to her after my 245. Thanks

## 2021-11-14 NOTE — Therapy (Signed)
OUTPATIENT PHYSICAL THERAPY THORACOLUMBAR EVALUATION   Patient Name: Shawna Suarez MRN: [id number] DOB:[birth year], 61 y.o., female Today's Date: 11/15/2021   PT End of Session - 11/15/21 1242     Visit Number 1    Number of Visits 8    Authorization Type [employer]    PT Start Time 1238    PT Stop Time 1316    PT Time Calculation (min) 38 min    Activity Tolerance Patient tolerated treatment well    Behavior During Therapy 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 10/09/2021    PCP: Tannenbaum, [place], MD  REFERRING PROVIDER: Alric Ran, MD  REFERRING DIAG: Diagnosis M54.16 (ICD-10-CM) - Lumbar radiculopathy M21.371 (ICD-10-CM) - Right foot drop   THERAPY DIAG:  Muscle weakness (generalized)  Other abnormalities of gait and mobility  Pain in right lower leg  ONSET DATE: October 2022  SUBJECTIVE:                                                                                                                                                                                           SUBJECTIVE STATEMENT: Patient recalls Rt sided lower leg pain and weakness which developed in October. She has had back pain in the past but she does not think this is the same problem.  She has since then she has been working on it and the pain is better.   " Im scared to walk on it a long period of time because it wears me out (around the block)". 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 "I cant walk backwards without feeling like Im going to fall over"  PERTINENT HISTORY:  From previous provider:  This is a 61 year old woman with no reported past medical history who is presenting with complaint of right lower extremity pain, described as  shooting pain, and right foot weakness.  Patient said the symptoms started since last September after receiving the second dose of COVID.  Since that she has been having intermittent pain.  She followed with orthopedist, had a lumbar MRI which showed no abnormality other than degenerative disc disease.  She is able to ambulate but stated she has to be very careful, denies any falls.  She reported the pain is located right at the hip joint and radiates  behind right leg all the way down to the foot, there is also report of numbness on the bottom of the foot.  Denies back pain.   PAIN:  Are you having pain? No.  Sore. Can be 7/10 end of the day  NPRS scale: 0/10 Pain location: Rt thigh to ankle Pain orientation: Right, Lateral, Upper, and Lower  PAIN TYPE: aching and soreness Pain description:  soreness   Aggravating factors: walking a lot  Relieving factors: rest  PRECAUTIONS: None  WEIGHT BEARING RESTRICTIONS No  FALLS:  Has patient fallen in last 6 months? No, Number of falls: 0  LIVING ENVIRONMENT: Lives with: lives with their family and lives alone Lives in: House/apartment Stairs: No;  Has following equipment at home: Single point cane, Environmental consultant - 2 wheeled, and Wheelchair (manual)  OCCUPATION: housekeeper at [employer]  PLOF: [place]::  I want to be able to do everything and get that leg back where I can move it. 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    Multilevel degenerative disc disease greatest at L5-S1.   Facet arthropathy greatest at L4-5 and L5-S1.    PATIENT SURVEYS:  FOTO 68%  COGNITION:  Overall cognitive status: Within functional limits for tasks assessed     SENSATION:  Light touch: Deficits Rt bottom of foot   Stereognosis: Appears intact  Hot/Cold: Appears intact  Proprioception: Appears  intact  MUSCLE LENGTH: Hamstrings: Right WNL deg; Left WNL deg Marcello Moores test: Right WNL deg; Left WNL deg  POSTURE:  Wide BOS   PALPATION: Sore lateral distal thigh and prox leg due to NCV  LUMBARAROM/PROM  A/PROM A/PROM  11/15/2021  Flexion WNL  Extension 50% back pain   Right lateral flexion 25%  Left lateral flexion 25% with Rt LE pain   Right rotation 25%  Lt. rotation 25%   (Blank rows = not tested)  LE AROM/PROM:  A/PROM Right 11/15/2021 Left 11/15/2021  Hip flexion    Hip extension    Hip abduction    Hip adduction    Hip internal rotation    Hip external rotation    Knee flexion    Knee extension    Ankle dorsiflexion    Ankle plantarflexion    Ankle inversion    Ankle eversion     (Blank rows = not tested)  LE MMT:  MMT Right 11/15/2021 Left 11/15/2021  Hip flexion 4/5 5/5  Hip extension    Hip abduction    Hip adduction    Hip internal rotation    Hip external rotation    Knee flexion 4/5 5/5  Knee extension 5/5 5/5  Ankle dorsiflexion 4/5 5/5  Ankle plantarflexion    Ankle inversion    Ankle eversion     (Blank rows = not tested)  LUMBAR SPECIAL TESTS:  Neg SLR   FUNCTIONAL TESTS:  5 times sit to stand: 11 sec  GAIT: Distance walked: 150 Assistive device utilized: None Level of assistance: Modified independence Comments: antalgic gait , decr foot clearance Rt foot, decr knee flexion and decr step length     TODAY'S TREATMENT  Pt eval and HEP, self care    PATIENT EDUCATION:  Education details: PT/POC, HEP  Person educated: Patient Education method: Consulting civil engineer, Verbal cues, and Handouts Education comprehension: verbalized understanding and needs further education   HOME EXERCISE PROGRAM: Access Code: [email or web address] URL: [email or web address]  Date: 11/15/2021 Prepared by: Raeford Razor  Exercises Seated Ankle Dorsiflexion AROM - 3 x daily - 7 x weekly - 1 sets - 20 reps - 30 hold Small Range Straight Leg Raise -  3 x daily - 7 x weekly - 2 sets - 10 reps - 5 hold Wall Quarter Squat - 3 x daily - 7 x weekly - 1 sets - 3 reps - 30 hold   ASSESSMENT:  CLINICAL IMPRESSION: Patient is a 61 y.o. female who was seen today for physical therapy evaluation and treatment for Rt foot drop due to .    OBJECTIVE IMPAIRMENTS Abnormal gait, decreased balance, decreased cognition, decreased mobility, difficulty walking, decreased ROM, decreased strength, impaired sensation, postural dysfunction, and pain.   ACTIVITY LIMITATIONS cleaning, community activity, occupation, yard work, and recreation .   PERSONAL FACTORS Profession and 1 comorbidity: low back pain   are also affecting patient's functional outcome.    REHAB POTENTIAL: Good  CLINICAL DECISION MAKING: Stable/uncomplicated  EVALUATION COMPLEXITY: Low   GOALS: Goals reviewed with patient? No  STG=LTG     LONG TERM GOALS:   LTG Name Target Date Goal status  1 Patient will be I with HEP for LE, core for long term health and fitness  Baseline: 12/27/2021 INITIAL  2 Pt will be able to improve foot clearance with gait, Rt LE Baseline: 12/27/2021 INITIAL  3 Pt will feel more steady with walking backwards, balance in general due to Rt ankle stability and control  Baseline: 12/27/2021 INITIAL  4 FOTO score will improve to 73% or more  Baseline: 12/27/2021 INITIAL  5 Pt will be able to improve single leg stance on Rt , holding for 15 sec or more for min dynamic activity  Baseline: 12/27/2021 INITIAL  6 Pt will report pain 3/10 in back , RT LE or less at the end of the work day  Baseline: 12/27/2021 INITIAL        PLAN: PT FREQUENCY: 1x/week  PT DURATION: 6 weeks  PLANNED INTERVENTIONS: Therapeutic exercises, Therapeutic activity, Neuro Muscular re-education, Balance training, Gait training, Patient/Family education, Cryotherapy, Moist heat, and Manual therapy  PLAN FOR NEXT SESSION: check HEP. LE strength- wall sit adding in tibialis ant taps etc.   Check other MMT of Rt ankle.     Raeford Razor, PT 11/15/21 1:42 PM Phone: [phone] Fax: [phone]  Shawna Suarez, PT 11/15/2021, 1:27 PM

## 2021-11-15 ENCOUNTER — Ambulatory Visit: Payer: Commercial Managed Care - PPO | Attending: Neurology | Admitting: Physical Therapy

## 2021-11-15 ENCOUNTER — Other Ambulatory Visit: Payer: Self-pay

## 2021-11-15 ENCOUNTER — Encounter: Payer: Self-pay | Admitting: Physical Therapy

## 2021-11-15 DIAGNOSIS — M21371 Foot drop, right foot: Secondary | ICD-10-CM | POA: Insufficient documentation

## 2021-11-15 DIAGNOSIS — M6281 Muscle weakness (generalized): Secondary | ICD-10-CM | POA: Diagnosis present

## 2021-11-15 DIAGNOSIS — R2689 Other abnormalities of gait and mobility: Secondary | ICD-10-CM | POA: Insufficient documentation

## 2021-11-15 DIAGNOSIS — M79661 Pain in right lower leg: Secondary | ICD-10-CM | POA: Insufficient documentation

## 2021-11-22 ENCOUNTER — Ambulatory Visit: Payer: Commercial Managed Care - PPO | Attending: Neurology

## 2021-11-22 ENCOUNTER — Other Ambulatory Visit: Payer: Self-pay

## 2021-11-22 DIAGNOSIS — M79661 Pain in right lower leg: Secondary | ICD-10-CM | POA: Diagnosis present

## 2021-11-22 DIAGNOSIS — R2689 Other abnormalities of gait and mobility: Secondary | ICD-10-CM | POA: Insufficient documentation

## 2021-11-22 DIAGNOSIS — M6281 Muscle weakness (generalized): Secondary | ICD-10-CM | POA: Insufficient documentation

## 2021-11-22 NOTE — Therapy (Signed)
?OUTPATIENT PHYSICAL THERAPY TREATMENT NOTE ? ? ?Patient Name: Shawna Suarez ?MRN: [id number] ?DOB:09/02/1961, 61 y.o., female ?Today's Date: 11/22/2021 ? ?PCP: Tannenbaum, [place], MD ?REFERRING PROVIDER: Windell Norfolk, MD ? ? ? ?Past Medical History:  ?Diagnosis Date  ? Arthritis   ? ?Past Surgical History:  ?Procedure Laterality Date  ? right breast biopsy  2002  ? ?Patient Active Problem List  ? Diagnosis Date Noted  ? 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 ? ?REFERRING DIAG: Diagnosis M54.16 (ICD-10-CM) - Lumbar radiculopathy M21.371 (ICD-10-CM) - Right foot drop  ? ?THERAPY DIAG: Muscle weakness (generalized) ?  ?Other abnormalities of gait and mobility ?  ?Pain in right lower leg ? ? ?PERTINENT HISTORY: This is a 61 year old woman with no reported past medical history who is presenting with complaint of right lower extremity pain, described as shooting pain, and right foot weakness.  Patient said the symptoms started since last September after receiving the second dose of COVID.  Since that she has been having intermittent pain.  She followed with orthopedist, had a lumbar MRI which showed no abnormality other than degenerative disc disease.  She is able to ambulate but stated she has to be very careful, denies any falls.  She reported the pain is located right at the hip joint and radiates behind right leg all the way down to the foot, there is also report of numbness on the bottom of the foot.  Denies back pain.  ? ?PRECAUTIONS: None ? ?SUBJECTIVE: No changes to report since previous session, has been performing HEP but wall squats bother knees. ? ?PAIN:  ?Are you having pain? No ? ? ? ? ? ?OBJECTIVE:  ?  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 ?  ?  ?Multilevel degenerative disc disease greatest at L5-S1. ?  ?Facet arthropathy greatest at L4-5 and L5-S1. ?  ?  ?PATIENT SURVEYS:  ?FOTO 68% ?  ?COGNITION: ?          Overall cognitive status: Within functional limits for tasks assessed              ?          ?SENSATION: ?         Light touch: Deficits Rt bottom of foot  ?         Stereognosis: Appears intact ?         Hot/Cold: Appears intact ?         Proprioception: Appears intact ?  ?MUSCLE LENGTH: ?Hamstrings: Right WNL deg; Left WNL deg ?Thomas test: Right WNL deg; Left WNL deg ?  ?POSTURE:  ?Wide BOS ?  ?  ?PALPATION: ?Sore lateral distal thigh and prox leg due to NCV ?  ?LUMBARAROM/PROM ?  ?A/PROM A/PROM  ?11/15/2021  ?Flexion WNL  ?Extension 50% back pain   ?Right lateral flexion 25%  ?Left lateral flexion 25% with Rt LE pain   ?Right rotation 25%  ?Lt. rotation 25%  ? (Blank rows = not tested) ?  ?LE AROM/PROM: ?  ?A/PROM Right ?11/15/2021 Left ?11/15/2021  ?Hip flexion      ?Hip extension      ?Hip abduction      ?Hip adduction      ?Hip internal rotation      ?Hip external rotation      ?Knee flexion      ?Knee extension      ?Ankle dorsiflexion      ?Ankle  plantarflexion      ?Ankle inversion      ?Ankle eversion      ? (Blank rows = not tested) ?  ?LE MMT: ?  ?MMT Right ?11/15/2021 Left ?11/15/2021  ?Hip flexion 4/5 5/5  ?Hip extension      ?Hip abduction      ?Hip adduction      ?Hip internal rotation      ?Hip external rotation      ?Knee flexion 4/5 5/5  ?Knee extension 5/5 5/5  ?Ankle dorsiflexion 4/5 5/5  ?Ankle plantarflexion      ?Ankle inversion  3(11/22/21)    ?Ankle eversion  3+(11/22/21)    ? (Blank rows = not tested) ?  ?LUMBAR SPECIAL TESTS:  ?Neg SLR  ?  ?FUNCTIONAL TESTS:  ?5 times sit to stand: 11 sec ?  ?GAIT: ?Distance walked: 150 ?Assistive device utilized: None ?Level of assistance: Modified independence ?Comments: antalgic gait , decr foot clearance Rt foot, decr knee flexion and decr step length  ?  ?  ?  ?TODAY'S TREATMENT  ?[facility] Adult PT Treatment:                                                DATE: 11/22/21 ?Therapeutic Exercise: ?ITB stretch R 30s x3 ?SAQs 15x2 ?Bridge 15x ?Seated DF  x20 ?Standing DF back to wall 15x ?Standing PF facing wall 15x ?Supine hip fallouts RTB 30x ?SL clams RTB 30x B ?Nustep L2 8 min ? ?Pt eval and HEP, self care  ?  ?  ?PATIENT EDUCATION:  ?Education details: PT/POC, HEP  ?Person educated: Patient ?Education method: Explanation, Verbal cues, and Handouts ?Education comprehension: verbalized understanding and needs further education ?  ?  ?HOME EXERCISE PROGRAM: ?Access Code: [email or web address] ?URL: [email or web address] ?Date: 11/15/2021 ?Prepared by: Karie Mainland ?  ?Exercises ?Seated Ankle Dorsiflexion AROM - 3 x daily - 7 x weekly - 1 sets - 20 reps - 30 hold ?Small Range Straight Leg Raise - 3 x daily - 7 x weekly - 2 sets - 10 reps - 5 hold ?Wall Quarter Squat - 3 x daily - 7 x weekly - 1 sets - 3 reps - 30 hold ?  ?  ?ASSESSMENT: ?  ?CLINICAL IMPRESSION: Review of HEP as well as addition of new exercises for hip/knee strengthening.  Discussed need to increase R step length and advance R foot forward to prevent toe scuff.  Shows a balance disturbance when turning due to low confidence in RLE strength.  Added R ITB stretch to HEP  ? ?  ?  ?OBJECTIVE IMPAIRMENTS Abnormal gait, decreased balance, decreased cognition, decreased mobility, difficulty walking, decreased ROM, decreased strength, impaired sensation, postural dysfunction, and pain.  ?  ?ACTIVITY LIMITATIONS cleaning, community activity, occupation, yard work, and recreation .  ?  ?PERSONAL FACTORS Profession and 1 comorbidity: low back pain   are also affecting patient's functional outcome.  ?  ?  ?REHAB POTENTIAL: Good ?  ?CLINICAL DECISION MAKING: Stable/uncomplicated ?  ?EVALUATION COMPLEXITY: Low ?  ?  ?GOALS: ?Goals reviewed with patient? No ?  ?STG=LTG ?  ?  ?  ?LONG TERM GOALS:  ?  ?LTG Name Target Date Goal status  ?1 Patient will be I with HEP for LE, core for long term health and fitness  ?Baseline: 12/27/2021 INITIAL  ?2 Pt will be able to improve  foot clearance with gait, Rt LE ?Baseline:  12/27/2021 INITIAL  ?3 Pt will feel more steady with walking backwards, balance in general due to Rt ankle stability and control  ?Baseline: 12/27/2021 INITIAL  ?4 FOTO score will improve to 73% or more  ?Baseline: 12/27/2021 INITIAL  ?5 Pt will be able to improve single leg stance on Rt , holding for 15 sec or more for min dynamic activity  ?Baseline: 12/27/2021 INITIAL  ?6 Pt will report pain 3/10 in back , RT LE or less at the end of the work day  ?Baseline: 12/27/2021 INITIAL  ?         ?  ?PLAN: ?PT FREQUENCY: 1x/week ?  ?PT DURATION: 6 weeks ?  ?PLANNED INTERVENTIONS: Therapeutic exercises, Therapeutic activity, Neuro Muscular re-education, Balance training, Gait training, Patient/Family education, Cryotherapy, Moist heat, and Manual therapy ?  ?PLAN FOR NEXT SESSION: check HEP and add as needed, focus on gait and function training emphasis on RLE strength, balance tasks ? ? ? ?Hildred Laser, PT ?11/22/2021, 10:46 AM ? ?   ?

## 2021-11-27 ENCOUNTER — Ambulatory Visit: Payer: Commercial Managed Care - PPO

## 2021-11-27 ENCOUNTER — Other Ambulatory Visit: Payer: Self-pay

## 2021-11-27 DIAGNOSIS — R2689 Other abnormalities of gait and mobility: Secondary | ICD-10-CM | POA: Diagnosis not present

## 2021-11-27 DIAGNOSIS — M6281 Muscle weakness (generalized): Secondary | ICD-10-CM

## 2021-11-27 DIAGNOSIS — M79661 Pain in right lower leg: Secondary | ICD-10-CM

## 2021-11-27 NOTE — Therapy (Signed)
OUTPATIENT PHYSICAL THERAPY TREATMENT NOTE   Patient Name: Shawna Suarez MRN: [id number] DOB:12-02-1960, 61 y.o., female Today's Date: 11/27/2021  PCP: Tannenbaum, [place], MD REFERRING PROVIDER: Tannenbaum, [place], MD   PT End of Session - 11/27/21 1604     Visit Number 3    Number of Visits 8    Authorization Type UHC    PT Start Time 1615    PT Stop Time 1700    PT Time Calculation (min) 45 min    Activity Tolerance Patient tolerated treatment well    Behavior During Therapy [facility] for tasks assessed/performed             Past Medical History:  Diagnosis Date   Arthritis    Past Surgical History:  Procedure Laterality Date   right breast biopsy  2002   Patient Active Problem List   Diagnosis Date Noted   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  Patient said the symptoms started since last September after receiving the second dose of COVID.  Since that she has been having intermittent pain.  She followed with orthopedist, had a lumbar MRI which showed no abnormality other than degenerative disc disease.  She is able to ambulate but stated she has to be very careful, denies any falls.  She reported the pain is located right at the hip joint and radiates behind right leg all the way down to the foot, there is also report of numbness on the bottom of the foot.  Denies back pain.   PRECAUTIONS: None  SUBJECTIVE: Fatigued from work today. Continues to report difficulty with turning and walking backwards.  Denies fall or R foot  scuff  PAIN:  Are you having pain? 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     Multilevel degenerative disc disease greatest at L5-S1.   Facet arthropathy greatest at L4-5 and L5-S1.     PATIENT SURVEYS:  FOTO 68%   COGNITION:          Overall cognitive status: Within functional limits for tasks assessed                        SENSATION:          Light touch: Deficits Rt bottom of foot           Stereognosis: Appears intact          Hot/Cold: Appears intact          Proprioception: Appears intact   MUSCLE LENGTH: Hamstrings: Right WNL deg; Left WNL deg Maisie Fus test: Right WNL deg; Left WNL deg   POSTURE:  Wide BOS     PALPATION: Sore lateral distal thigh and prox leg due to NCV   LUMBARAROM/PROM   A/PROM A/PROM  11/15/2021  Flexion WNL  Extension 50% back pain   Right lateral flexion 25%  Left lateral flexion 25% with Rt LE pain   Right rotation 25%  Lt. rotation 25%   (Blank rows =  not tested)   LE AROM/PROM:   A/PROM Right 11/15/2021 Left 11/15/2021  Hip flexion      Hip extension      Hip abduction      Hip adduction      Hip internal rotation      Hip external rotation      Knee flexion      Knee extension      Ankle dorsiflexion      Ankle plantarflexion      Ankle inversion      Ankle eversion       (Blank rows = not tested)   LE MMT:   MMT Right 11/15/2021 Left 11/15/2021  Hip flexion 4/5 5/5  Hip extension      Hip abduction      Hip adduction      Hip internal rotation      Hip external rotation      Knee flexion 4/5 5/5  Knee extension 5/5 5/5  Ankle dorsiflexion 4/5 5/5  Ankle plantarflexion      Ankle inversion  3(11/22/21)    Ankle eversion  3+(11/22/21)     (Blank rows = not tested)   LUMBAR SPECIAL TESTS:  Neg SLR    FUNCTIONAL TESTS:  5 times sit to stand: 11 sec   GAIT: Distance walked: 150 Assistive device utilized:  None Level of assistance: Modified independence Comments: antalgic gait , decr foot clearance Rt foot, decr knee flexion and decr step length        TODAY'S TREATMENT   [facility] Adult PT Treatment:                                                DATE: 11/27/21 Therapeutic Exercise: R SLR x15 Bridge x15  Neuromuscular re-ed:  Retrowalking 5 ft distances VCs for slower cadence and increase step length  Wall PF/DF 15x each  Balance tasks of head 5 turns/nods from compliant/non-compliant surfaces in EO/EC environment,       11/27/21 0001  Berg Balance Test  Sit to Stand 3  Standing Unsupported 4  Sitting with Back Unsupported but Feet Supported on Floor or Stool 4  Stand to Sit 4  Transfers 3  Standing Unsupported with Eyes Closed 4  Standing Unsupported with Feet Together 4  From Standing, Reach Forward with Outstretched Arm 4  From Standing Position, Pick up Object from Floor 4  From Standing Position, Turn to Look Behind Over each Shoulder 4  Turn 360 Degrees 2  Standing Unsupported, Alternately Place Feet on Step/Stool 4  Standing Unsupported, One Foot in Front 3  Standing on One Leg 2  Total Score 49     [facility] Adult PT Treatment:                                                DATE: 11/22/21 Therapeutic Exercise: ITB stretch R 30s x3 SAQs 15x2 Bridge 15x Seated DF x20 Standing DF back to wall 15x Standing PF facing wall 15x Supine hip fallouts RTB 30x SL clams RTB 30x B Nustep L2 8 min  Pt eval and HEP, self care      PATIENT EDUCATION:  Education details: PT/POC, HEP  Person educated: Patient Education method:  Explanation, Verbal cues, and Handouts Education comprehension: verbalized understanding and needs further education     HOME EXERCISE PROGRAM: Access Code: [email or web address] URL: [email or web address] Date: 11/27/2021 Prepared by: Gustavus Bryant  Exercises Supine Active Straight Leg Raise - 2 x daily - 7 x weekly - 2 sets - 10 reps Supine  Bridge - 2 x daily - 7 x weekly - 2 sets - 10 reps Heel Toe Raises with Counter Support - 2 x daily - 7 x weekly - 2 sets - 10 reps      ASSESSMENT:   CLINICAL IMPRESSION: BERG score 49/56 showing minimal fall risk.  Gait assessment shows festinating like features with a relutance to advance RLE forward taking a shorter step length.  No LOB noted from non-compliant surfaces with EO but balance loss noted on compliant surfaces vision removed suggesting proprioceptive deficits in LEs      OBJECTIVE IMPAIRMENTS Abnormal gait, decreased balance, decreased cognition, decreased mobility, difficulty walking, decreased ROM, decreased strength, impaired sensation, postural dysfunction, and pain.    ACTIVITY LIMITATIONS cleaning, community activity, occupation, yard work, and recreation .    PERSONAL FACTORS Profession and 1 comorbidity: low back pain   are also affecting patient's functional outcome.      REHAB POTENTIAL: Good   CLINICAL DECISION MAKING: Stable/uncomplicated   EVALUATION COMPLEXITY: Low     GOALS: Goals reviewed with patient? No   STG=LTG       LONG TERM GOALS:    LTG Name Target Date Goal status  1 Patient will be I with HEP for LE, core for long term health and fitness  Baseline: 12/27/2021 INITIAL  2 Pt will be able to improve foot clearance with gait, Rt LE Baseline: 12/27/2021 INITIAL  3 Pt will feel more steady with walking backwards, balance in general due to Rt ankle stability and control  Baseline: 12/27/2021 INITIAL  4 FOTO score will improve to 73% or more  Baseline: 12/27/2021 INITIAL  5 Pt will be able to improve single leg stance on Rt , holding for 15 sec or more for min dynamic activity  Baseline: 12/27/2021 INITIAL  6 Pt will report pain 3/10 in back , RT LE or less at the end of the work day  Baseline: 12/27/2021 INITIAL             PLAN: PT FREQUENCY: 1x/week   PT DURATION: 6 weeks   PLANNED INTERVENTIONS: Therapeutic exercises, Therapeutic  activity, Neuro Muscular re-education, Balance training, Gait training, Patient/Family education, Cryotherapy, Moist heat, and Manual therapy   PLAN FOR NEXT SESSION: Continue to narrow down balance dysfunction, balance and proprioceptive work, R hip/knee strngthening    Hildred Laser, PT 11/27/2021, 4:05 PM

## 2021-12-04 ENCOUNTER — Other Ambulatory Visit: Payer: Self-pay

## 2021-12-04 ENCOUNTER — Ambulatory Visit: Payer: Commercial Managed Care - PPO

## 2021-12-04 DIAGNOSIS — R2689 Other abnormalities of gait and mobility: Secondary | ICD-10-CM

## 2021-12-04 DIAGNOSIS — M6281 Muscle weakness (generalized): Secondary | ICD-10-CM

## 2021-12-04 DIAGNOSIS — M79661 Pain in right lower leg: Secondary | ICD-10-CM

## 2021-12-04 NOTE — Therapy (Signed)
?OUTPATIENT PHYSICAL THERAPY TREATMENT NOTE ? ? ?Patient Name: Shawna Suarez ?MRN: [id number] ?DOB:09-29-1960, 61 y.o., female ?Today's Date: 12/04/2021 ? ?PCP: Tannenbaum, [place], MD ?REFERRING PROVIDER: Tannenbaum, [place], MD ? ? PT End of Session - 12/04/21 1654   ? ? Visit Number 4   ? Number of Visits 8   ? Authorization Type UHC   ? PT Start Time [id number]   ? PT Stop Time 1740   ? PT Time Calculation (min) 45 min   ? Activity Tolerance Patient tolerated treatment well   ? Behavior During Therapy [facility] for tasks assessed/performed   ? ?  ?  ? ?  ? ? ? ?Past Medical History:  ?Diagnosis Date  ? Arthritis   ? ?Past Surgical History:  ?Procedure Laterality Date  ? right breast biopsy  2002  ? ?Patient Active Problem List  ? Diagnosis Date Noted  ? 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 ? ?REFERRING DIAG: Diagnosis M54.16 (ICD-10-CM) - Lumbar radiculopathy M21.371 (ICD-10-CM) - Right foot drop  ? ?THERAPY DIAG: Muscle weakness (generalized) ?  ?Other abnormalities of gait and mobility ?  ?Pain in right lower leg ? ? ?PERTINENT HISTORY: This is a 60 year old woman with no reported past medical history who is presenting with complaint of right lower extremity pain, described as shooting pain, and right foot weakness.  Patient said the symptoms started since last September after receiving the second dose of COVID.  Since that she has been having intermittent pain.  She followed with orthopedist, had a lumbar MRI which showed no abnormality other than degenerative disc disease.  She is able to ambulate but stated she has to be very careful, denies any falls.  She reported the pain is located right at the hip joint and radiates behind right leg all the way down to the foot, there is also report of numbness on the bottom of the foot.  Denies back pain.  ? ?PRECAUTIONS: None ? ?SUBJECTIVE: Patient reports that she fell yesterday while she was at work. Her R knee and L hip are sore from the fall. She fell  straight forward and states that her R leg just didn't move forward. ? ?PAIN:  ?Are you having pain? No (denies pain, states it is just soreness) ? ? ?OBJECTIVE:  ?  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 ?  ?  ?Multilevel degenerative disc disease greatest at L5-S1. ?  ?Facet arthropathy greatest at L4-5 and L5-S1. ?  ?  ?PATIENT SURVEYS:  ?FOTO 68% ?  ?COGNITION: ?         Overall cognitive status: Within functional limits for tasks assessed              ?          ?SENSATION: ?         Light touch: Deficits Rt bottom of foot  ?         Stereognosis: Appears intact ?         Hot/Cold: Appears intact ?         Proprioception: Appears intact ?  ?MUSCLE LENGTH: ?Hamstrings: Right WNL deg; Left WNL deg ?Thomas test: Right WNL deg; Left WNL deg ?  ?POSTURE:  ?Wide BOS ?  ?  ?PALPATION: ?Sore lateral distal thigh and prox leg due to NCV ?  ?LUMBARAROM/PROM ?  ?A/PROM A/PROM  ?11/15/2021  ?Flexion WNL  ?Extension 50% back pain   ?Right lateral  flexion 25%  ?Left lateral flexion 25% with Rt LE pain   ?Right rotation 25%  ?Lt. rotation 25%  ? (Blank rows = not tested) ?  ?LE AROM/PROM: ?  ?A/PROM Right ?11/15/2021 Left ?11/15/2021  ?Hip flexion      ?Hip extension      ?Hip abduction      ?Hip adduction      ?Hip internal rotation      ?Hip external rotation      ?Knee flexion      ?Knee extension      ?Ankle dorsiflexion      ?Ankle plantarflexion      ?Ankle inversion      ?Ankle eversion      ? (Blank rows = not tested) ?  ?LE MMT: ?  ?MMT Right ?11/15/2021 Left ?11/15/2021  ?Hip flexion 4/5 5/5  ?Hip extension      ?Hip abduction      ?Hip adduction      ?Hip internal rotation      ?Hip external rotation      ?Knee flexion 4/5 5/5  ?Knee extension 5/5 5/5  ?Ankle dorsiflexion 4/5 5/5  ?Ankle plantarflexion      ?Ankle inversion  3(11/22/21)    ?Ankle eversion  3+(11/22/21)    ? (Blank rows = not tested) ?  ?LUMBAR SPECIAL TESTS:  ?Neg SLR  ?   ?FUNCTIONAL TESTS:  ?5 times sit to stand: 11 sec ?  ?GAIT: ?Distance walked: 150 ?Assistive device utilized: None ?Level of assistance: Modified independence ?Comments: antalgic gait , decr foot clearance Rt foot, decr knee flexion and decr step length  ?  ?  ?  ?TODAY'S TREATMENT  ?[facility] Adult PT Treatment:                                                DATE: 12/04/2021 ?Therapeutic Exercise: ?Nustep level 5 x 5 mins while gathering subjective ?Standing hip abduction in // bars 2x10 BIL ?Standing hip extension in // bars 2x10 BIL ?Neuromuscular re-ed: ?Romberg stance on foam EO/EC 2x30" ?Head nods/turns on foam EO/EC x5 each ?Marching on foam x20 ?Therapeutic Activity: ?Gait training x 2 laps with cues for heel to toe  ?Gait training retrowalking x 2 laps with cues for slower cadence  ?Gait training over yellow hurdles with cues for lifting knees ?Sidestepping over yellow hurdles x2 laps ?Runners step onto airex x10 BIL ?Step up and over on airex x10 ? ? ?[facility] Adult PT Treatment:                                                DATE: 11/27/21 ?Therapeutic Exercise: ?R SLR x15 ?Bridge x15 ? ?Neuromuscular re-ed: ? Retrowalking 5 ft distances VCs for slower cadence and increase step length ? Wall PF/DF 15x each ? Balance tasks of head 5 turns/nods from compliant/non-compliant surfaces in EO/EC environment,  ?  ? ? ? 11/27/21 0001  ?Berg Balance Test  ?Sit to Stand 3  ?Standing Unsupported 4  ?Sitting with Back Unsupported but Feet Supported on Floor or Stool 4  ?Stand to Sit 4  ?Transfers 3  ?Standing Unsupported with Eyes Closed 4  ?Standing Unsupported with Feet Together 4  ?From Standing, Reach  Forward with Outstretched Arm 4  ?From Standing Position, Pick up Object from Floor 4  ?From Standing Position, Turn to Look Behind Over each Shoulder 4  ?Turn 360 Degrees 2  ?Standing Unsupported, Alternately Place Feet on Step/Stool 4  ?Standing Unsupported, One Foot in Front 3  ?Standing on One Leg 2  ?Total Score 49   ? ? ? ?[facility] Adult PT Treatment:                                                DATE: 11/22/21 ?Therapeutic Exercise: ?ITB stretch R 30s x3 ?SAQs 15x2 ?Bridge 15x ?Seated DF x20 ?Standing DF back to wall 15x ?Standing PF facing wall 15x ?Supine hip fallouts RTB 30x ?SL clams RTB 30x B ?Nustep L2 8 min ? ?Pt eval and HEP, self care  ?  ?  ?PATIENT EDUCATION:  ?Education details: PT/POC, HEP  ?Person educated: Patient ?Education method: Explanation, Verbal cues, and Handouts ?Education comprehension: verbalized understanding and needs further education ?  ?  ?HOME EXERCISE PROGRAM: ?Access Code: [email or web address] ?URL: [email or web address] ?Date: 11/27/2021 ?Prepared by: Sharlynn Oliphant ? ?Exercises ?Supine Active Straight Leg Raise - 2 x daily - 7 x weekly - 2 sets - 10 reps ?Supine Bridge - 2 x daily - 7 x weekly - 2 sets - 10 reps ?Heel Toe Raises with Counter Support - 2 x daily - 7 x weekly - 2 sets - 10 reps ? ?  ?  ?ASSESSMENT: ?  ?CLINICAL IMPRESSION:  ?Patient presents to PT with no current pain but states that she fell yesterday because her right leg didn't "come along" while she was walking. She presents to PT with a festinating like gait with her trunk flexed and taking small steps. When cued for heel/toe and to lift the knees she is able to complete, but quickly reverts back when she loses focus. Today's session focused on gait training with heel/toe cues and increasing stride length. Patient continues to benefit from skilled PT services and should be progressed as able to improve functional independence. ?  ?  ?OBJECTIVE IMPAIRMENTS Abnormal gait, decreased balance, decreased cognition, decreased mobility, difficulty walking, decreased ROM, decreased strength, impaired sensation, postural dysfunction, and pain.  ?  ?ACTIVITY LIMITATIONS cleaning, community activity, occupation, yard work, and recreation .  ?  ?PERSONAL FACTORS Profession and 1 comorbidity: low back pain   are also affecting patient's  functional outcome.  ?  ?  ?REHAB POTENTIAL: Good ?  ?CLINICAL DECISION MAKING: Stable/uncomplicated ?  ?EVALUATION COMPLEXITY: Low ?  ?  ?GOALS: ?Goals reviewed with patient? No ?  ?STG=LTG ?  ?  ?  ?LONG TERM GOALS

## 2021-12-11 ENCOUNTER — Other Ambulatory Visit: Payer: Self-pay

## 2021-12-11 ENCOUNTER — Ambulatory Visit: Payer: Commercial Managed Care - PPO

## 2021-12-11 DIAGNOSIS — R2689 Other abnormalities of gait and mobility: Secondary | ICD-10-CM

## 2021-12-11 DIAGNOSIS — M6281 Muscle weakness (generalized): Secondary | ICD-10-CM

## 2021-12-11 DIAGNOSIS — M79661 Pain in right lower leg: Secondary | ICD-10-CM

## 2021-12-11 NOTE — Therapy (Addendum)
?OUTPATIENT PHYSICAL THERAPY TREATMENT NOTE ? ? ?Patient Name: Shawna Suarez ?MRN: [id number] ?DOB:01/28/1961, 61 y.o., female ?Today's Date: 12/11/2021 ? ?PCP: Tannenbaum, [place], MD ?REFERRING PROVIDER: Tannenbaum, [place], MD ? ? PT End of Session - 12/11/21 1700   ? ? Visit Number 5   ? Number of Visits 8   ? Authorization Type UHC   ? PT Start Time 1700   ? PT Stop Time 1740   ? PT Time Calculation (min) 40 min   ? Activity Tolerance Patient tolerated treatment well   ? Behavior During Therapy [facility] for tasks assessed/performed   ? ?  ?  ? ?  ? ? ? ?Past Medical History:  ?Diagnosis Date  ? Arthritis   ? ?Past Surgical History:  ?Procedure Laterality Date  ? right breast biopsy  2002  ? ?Patient Active Problem List  ? Diagnosis Date Noted  ? 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 ? ?REFERRING DIAG: Diagnosis M54.16 (ICD-10-CM) - Lumbar radiculopathy M21.371 (ICD-10-CM) - Right foot drop  ? ?THERAPY DIAG: Muscle weakness (generalized) ?  ?Other abnormalities of gait and mobility ?  ?Pain in right lower leg ? ? ?PERTINENT HISTORY: This is a 61 year old woman with no reported past medical history who is presenting with complaint of right lower extremity pain, described as shooting pain, and right foot weakness.  Patient said the symptoms started since last September after receiving the second dose of COVID.  Since that she has been having intermittent pain.  She followed with orthopedist, had a lumbar MRI which showed no abnormality other than degenerative disc disease.  She is able to ambulate but stated she has to be very careful, denies any falls.  She reported the pain is located right at the hip joint and radiates behind right leg all the way down to the foot, there is also report of numbness on the bottom of the foot.  Denies back pain.  ? ?PRECAUTIONS: None ? ?SUBJECTIVE: No falls has been working on pacing and advancing RLE first, acknowledges she hesitates and has difficulty advancing LEs  forward feeling like her feet are"stuck to the floor".  Feels she does not have time to pace at work and feels she needs to maintain a fast cadence to get her work done. ? ?PAIN:  ?Are you having pain? No (denies pain, states it is just soreness) ? ? ?OBJECTIVE:  ?  ?DIAGNOSTIC FINDINGS:  ?Visit Date:                     10/04/2021 07:45 ?Age:                               76 Years ?Examining Physician:  Sarina Ill, MD  ?Referring Physician:    Alric Ran, MD ?Height:                           5 feet 3 inch 153lbs ?Patient History:            Right leg distal weakness and sensory changes ?  ?Summary: Nerve conduction studies were conducted on the bilateral lower extremities.  EMG needle exam was performed on the right lower extremity.  The right peroneal motor nerve (recording at the right tibialis anterior muscle) showed no response (popliteal fossa -fibular head). All remaining nerves (as indicated in the following tables) were within normal limits.  All muscles (as indicated in the following tables) were within normal limits.   ?   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 ?  ?  ?Multilevel degenerative disc disease greatest at L5-S1. ?  ?Facet arthropathy greatest at L4-5 and L5-S1. ?  ?  ?PATIENT SURVEYS:  ?FOTO 68% ?  ?COGNITION: ?         Overall cognitive status: Within functional limits for tasks assessed              ?          ?SENSATION: ?         Light touch: Deficits Rt bottom of foot  ?         Stereognosis: Appears intact ?         Hot/Cold: Appears intact ?         Proprioception: Appears intact ?  ?MUSCLE LENGTH: ?Hamstrings: Right WNL deg; Left WNL deg ?Thomas test: Right WNL deg; Left WNL deg ?  ?POSTURE:  ?Wide BOS ?  ?  ?PALPATION: ?Sore lateral distal thigh and prox leg due to NCV ?  ?LUMBARAROM/PROM ?  ?A/PROM A/PROM  ?11/15/2021  ?Flexion WNL  ?Extension 50% back pain   ?Right lateral flexion 25%  ?Left lateral flexion  25% with Rt LE pain   ?Right rotation 25%  ?Lt. rotation 25%  ? (Blank rows = not tested) ?  ?LE AROM/PROM: ?  ?A/PROM Right ?11/15/2021 Left ?11/15/2021  ?Hip flexion      ?Hip extension      ?Hip abduction      ?Hip adduction      ?Hip internal rotation      ?Hip external rotation      ?Knee flexion      ?Knee extension      ?Ankle dorsiflexion      ?Ankle plantarflexion      ?Ankle inversion      ?Ankle eversion      ? (Blank rows = not tested) ?  ?LE MMT: ?  ?MMT Right ?11/15/2021 Left ?11/15/2021  ?Hip flexion 4/5 5/5  ?Hip extension      ?Hip abduction      ?Hip adduction      ?Hip internal rotation      ?Hip external rotation      ?Knee flexion 4/5 5/5  ?Knee extension 5/5 5/5  ?Ankle dorsiflexion 4/5 5/5  ?Ankle plantarflexion      ?Ankle inversion  3(11/22/21)    ?Ankle eversion  3+(11/22/21)    ? (Blank rows = not tested) ?  ?LUMBAR SPECIAL TESTS:  ?Neg SLR  ?  ?FUNCTIONAL TESTS:  ?5 times sit to stand: 11 sec ?  ?GAIT: ?Distance walked: 150 ?Assistive device utilized: None ?Level of assistance: Modified independence ?Comments: antalgic gait , decr foot clearance Rt foot, decr knee flexion and decr step length  ?  ?  ?  ?TODAY'S TREATMENT  ?[facility] Adult PT Treatment:                                                DATE: 12/11/21 ?Therapeutic Exercise: ?Nustep L2 x8 min >50 SPM ?Supine marching 5# R 15/15 ?Slant board 30s x3 ?Neuromuscular re-ed: ?Runners step 4" 15x R 5# ?Step over 4 hurdles along counter 4 trips 5# R ?Wall marching 15/15 5# R ?  Wall PF/DF 15/15 ?R stepping to 4" block 5# R 15x ?Gait through clinic 5# on RLE with PT increasing cadence to facilitate rapid advancement of LEs and cue for R heel strike ?Tandem stance on Airex, EO, 30s x2 each position w/o UE support ? ? ?[facility] Adult PT Treatment:                                                DATE: 12/04/2021 ?Therapeutic Exercise: ?Nustep level 5 5 min ?Standing hip abduction in // bars 2x10 BIL ?Standing hip extension in // bars 2x10 BIL ?Neuromuscular  re-ed: ?Romberg stance on foam EO/EC 2x30" ?Head nods/turns on foam EO/EC x5 each ?Marching on foam x20 ?Therapeutic Activity: ?Gait training x 2 laps with cues for heel to toe  ?Gait training retrowalking x 2 laps with cues for slower cadence  ?Gait training over yellow hurdles with cues for lifting knees ?Sidestepping over yellow hurdles x2 laps ?Runners step onto airex x10 BIL ?Step up and over on airex x10 ? ? ?[facility] Adult PT Treatment:                                                DATE: 11/27/21 ?Therapeutic Exercise: ?R SLR x15 ?Bridge x15 ? ?Neuromuscular re-ed: ? Retrowalking 5 ft distances VCs for slower cadence and increase step length ? Wall PF/DF 15x each ? Balance tasks of head 5 turns/nods from compliant/non-compliant surfaces in EO/EC environment,  ?  ? ? ? 11/27/21 0001  ?Berg Balance Test  ?Sit to Stand 3  ?Standing Unsupported 4  ?Sitting with Back Unsupported but Feet Supported on Floor or Stool 4  ?Stand to Sit 4  ?Transfers 3  ?Standing Unsupported with Eyes Closed 4  ?Standing Unsupported with Feet Together 4  ?From Standing, Reach Forward with Outstretched Arm 4  ?From Standing Position, Pick up Object from Floor 4  ?From Standing Position, Turn to Look Behind Over each Shoulder 4  ?Turn 360 Degrees 2  ?Standing Unsupported, Alternately Place Feet on Step/Stool 4  ?Standing Unsupported, One Foot in Front 3  ?Standing on One Leg 2  ?Total Score 49  ? ? ? ?[facility] Adult PT Treatment:                                                DATE: 11/22/21 ?Therapeutic Exercise: ?ITB stretch R 30s x3 ?SAQs 15x2 ?Bridge 15x ?Seated DF x20 ?Standing DF back to wall 15x ?Standing PF facing wall 15x ?Supine hip fallouts RTB 30x ?SL clams RTB 30x B ?Nustep L2 8 min ? ?Pt eval and HEP, self care  ?  ?  ?PATIENT EDUCATION:  ?Education details: PT/POC, HEP  ?Person educated: Patient ?Education method: Explanation, Verbal cues, and Handouts ?Education comprehension: verbalized understanding and needs further education ?  ?   ?HOME EXERCISE PROGRAM: ?Access Code: [email or web address] ?URL: [email or web address] ?Date: 11/27/2021 ?Prepared by: Sharlynn Oliphant ? ?Exercises ?Supine Active Straight Leg Raise - 2 x daily - 7 x w

## 2021-12-19 ENCOUNTER — Ambulatory Visit: Payer: Commercial Managed Care - PPO

## 2021-12-19 ENCOUNTER — Telehealth: Payer: Self-pay

## 2021-12-19 DIAGNOSIS — R2689 Other abnormalities of gait and mobility: Secondary | ICD-10-CM | POA: Diagnosis not present

## 2021-12-19 DIAGNOSIS — M6281 Muscle weakness (generalized): Secondary | ICD-10-CM

## 2021-12-19 DIAGNOSIS — M79661 Pain in right lower leg: Secondary | ICD-10-CM

## 2021-12-19 NOTE — Telephone Encounter (Signed)
She still getting PT, I will defer to them.  ?Thanks

## 2021-12-19 NOTE — Therapy (Addendum)
OUTPATIENT PHYSICAL THERAPY TREATMENT NOTE/DC SUMMARY   Patient Name: Shawna Suarez MRN: [id number] DOB:16-Jun-1961, 61 y.o., female Today's Date: 12/19/2021  PCP: Tannenbaum, [place], MD REFERRING PROVIDER: Alric Ran, MD PHYSICAL THERAPY DISCHARGE SUMMARY  Visits from Start of Care: 6  Current functional level related to goals / functional outcomes: UTA   Remaining deficits: Gait instability   Education / Equipment: HEP, AFO recommendation   Patient agrees to discharge. Patient goals were partially met. Patient is being discharged due to not returning since the last visit.   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  Patient said the symptoms started since last September after receiving the second dose of COVID.  Since that she has been having intermittent pain.  She followed with orthopedist, had a lumbar MRI which showed no abnormality other than degenerative disc disease.  She is able to ambulate but stated she has to be very  careful, denies any falls.  She reported the pain is located right at the hip joint and radiates behind right leg all the way down to the foot, there is also report of numbness on the bottom of the foot.  Denies back pain.   PRECAUTIONS: None  SUBJECTIVE: No falls.  Has obtained a RX for and AFO and is awaiting an appointment to be fitted  PAIN:  Are you having pain? No (denies pain, states it is just soreness)   OBJECTIVE:    DIAGNOSTIC FINDINGS:  Visit Date:                     10/04/2021 07:45 Age:                               64 Years Examining Physician:  Sarina Ill, MD  Referring Physician:    Alric Ran, MD Height:                           5 feet 3 inch 153lbs Patient History:            Right leg distal weakness and sensory changes   Summary: Nerve conduction studies were conducted on the bilateral lower extremities.  EMG needle exam was performed on the right lower extremity.  The right peroneal motor nerve (recording at the  right tibialis anterior muscle) showed no response (popliteal fossa -fibular head). All remaining nerves (as indicated in the following tables) were within normal limits.  All muscles (as indicated in the following tables) were within normal limits.      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     Multilevel degenerative disc disease greatest at L5-S1.   Facet arthropathy greatest at L4-5 and L5-S1.     PATIENT SURVEYS:  FOTO 68%   COGNITION:          Overall cognitive status: Within functional limits for tasks assessed                        SENSATION:          Light touch: Deficits Rt bottom of foot           Stereognosis: Appears intact          Hot/Cold: Appears intact          Proprioception: Appears intact   MUSCLE LENGTH: Hamstrings: Right WNL deg; Left WNL deg Marcello Moores test: Right WNL deg; Left WNL deg   POSTURE:  Wide BOS     PALPATION: Sore lateral  distal thigh and prox leg due to NCV   LUMBARAROM/PROM   A/PROM A/PROM  11/15/2021  Flexion WNL  Extension 50% back pain   Right lateral flexion 25%  Left lateral flexion 25% with Rt LE pain   Right rotation 25%  Lt. rotation 25%   (Blank rows = not tested)   LE AROM/PROM:   A/PROM Right 11/15/2021 Left 11/15/2021  Hip flexion      Hip extension      Hip abduction      Hip adduction      Hip internal rotation      Hip external rotation      Knee flexion      Knee extension      Ankle dorsiflexion      Ankle plantarflexion      Ankle inversion      Ankle eversion       (Blank rows = not tested)   LE MMT:   MMT Right 11/15/2021 Left 11/15/2021  Hip flexion 4/5 5/5  Hip extension      Hip abduction      Hip adduction      Hip internal rotation      Hip external rotation      Knee flexion 4/5 5/5  Knee extension 5/5 5/5  Ankle dorsiflexion 4/5 5/5  Ankle plantarflexion      Ankle inversion  3(11/22/21)    Ankle eversion  3+(11/22/21)     (Blank rows = not tested)   LUMBAR SPECIAL TESTS:  Neg SLR    FUNCTIONAL TESTS:  5 times sit to stand: 11 sec   GAIT: Distance walked: 150 Assistive device utilized: None Level of assistance: Modified independence Comments: antalgic gait , decr foot clearance Rt foot, decr knee flexion and decr step length        TODAY'S TREATMENT  [facility] Adult PT Treatment:                                                DATE: 12/19/21 Therapeutic Exercise: Nustep L2 x8 min >50 SPM Wall PF/DF 15x each Wall marching 15x  unilateral facing wall, back to wall Neuromuscular re-ed: Runners step 4" 10x B Single leg on 4" block, WS forwrad and OH reach Gait through clinic YTB AFO on RLE to assess benefit of permanent brace  Tandem stance on Airex, EO, 30s x2 each position w/o UE support  [facility] Adult PT Treatment:                                                DATE: 12/11/21 Therapeutic Exercise: Nustep L2 x8 min >50 SPM Supine marching 5# R  15/15 Slant board 30s x3 Neuromuscular re-ed: Runners step 4" 15x R 5# Step over 4 hurdles along counter 4 trips 5# R Wall marching 15/15 5# R Wall PF/DF 15/15 R stepping to 4" block 5# R 15x Gait through clinic 5# on RLE with PT increasing cadence to facilitate rapid advancement of LEs and cue for R heel strike Tandem stance on Airex, EO, 30s x2 each position w/o UE support   [facility] Adult PT Treatment:                                                DATE: 12/04/2021 Therapeutic Exercise: Nustep level 5 5 min Standing hip abduction in // bars 2x10 BIL Standing hip extension in // bars 2x10 BIL Neuromuscular re-ed: Romberg stance on foam EO/EC 2x30" Head nods/turns on foam EO/EC x5 each Marching on foam x20 Therapeutic Activity: Gait training x 2 laps with cues for heel to toe  Gait training retrowalking x 2 laps with cues for slower cadence  Gait training over yellow hurdles with cues for lifting knees Sidestepping over yellow hurdles x2 laps Runners step onto airex x10 BIL Step up and over on airex x10   [facility] Adult PT Treatment:                                                DATE: 11/27/21 Therapeutic Exercise: R SLR x15 Bridge x15  Neuromuscular re-ed:  Retrowalking 5 ft distances VCs for slower cadence and increase step length  Wall PF/DF 15x each  Balance tasks of head 5 turns/nods from compliant/non-compliant surfaces in EO/EC environment,       11/27/21 0001  Berg Balance Test  Sit to Stand 3  Standing Unsupported 4  Sitting with Back Unsupported but Feet Supported on Floor or Stool 4  Stand to Sit 4  Transfers 3  Standing Unsupported with Eyes Closed 4  Standing Unsupported with Feet Together 4  From Standing, Reach Forward with Outstretched Arm 4  From Standing Position, Pick up Object from Floor 4  From Standing Position, Turn to Look Behind Over each Shoulder 4  Turn 360 Degrees 2  Standing Unsupported, Alternately Place Feet on Step/Stool 4  Standing  Unsupported, One Foot in [place] 3  Standing on One Leg 2  Total Score 49     [facility] Adult PT Treatment:  DATE: 11/22/21 Therapeutic Exercise: ITB stretch R 30s x3 SAQs 15x2 Bridge 15x Seated DF x20 Standing DF back to wall 15x Standing PF facing wall 15x Supine hip fallouts RTB 30x SL clams RTB 30x B Nustep L2 8 min  Pt eval and HEP, self care      PATIENT EDUCATION:  Education details: PT/POC, HEP  Person educated: Patient Education method: Consulting civil engineer, Verbal cues, and Handouts Education comprehension: verbalized understanding and needs further education     HOME EXERCISE PROGRAM: Access Code: [email or web address] URL: [email or web address] Date: 11/27/2021 Prepared by: Sharlynn Oliphant  Exercises Supine Active Straight Leg Raise - 2 x daily - 7 x weekly - 2 sets - 10 reps Supine Bridge - 2 x daily - 7 x weekly - 2 sets - 10 reps Heel Toe Raises with Counter Support - 2 x daily - 7 x weekly - 2 sets - 10 reps      ASSESSMENT:   CLINICAL IMPRESSION: No falls since last reported one.  Has Rx for an AFO and is awaiting fitting.  Temporary YTB AFO trialed in clinic today with patient showing continued difficulty in advancing hip/knee forward.  RLE festination observed with direction changes as well as when distracted.  Continued to work on balance tasks to promote advancement of RLE through hip/knee     OBJECTIVE IMPAIRMENTS Abnormal gait, decreased balance, decreased cognition, decreased mobility, difficulty walking, decreased ROM, decreased strength, impaired sensation, postural dysfunction, and pain.    ACTIVITY LIMITATIONS cleaning, community activity, occupation, yard work, and recreation .    PERSONAL FACTORS Profession and 1 comorbidity: low back pain   are also affecting patient's functional outcome.      REHAB POTENTIAL: Good   CLINICAL DECISION MAKING: Stable/uncomplicated   EVALUATION COMPLEXITY: Low      GOALS: Goals reviewed with patient? No   STG=LTG       LONG TERM GOALS:    LTG Name Target Date Goal status  1 Patient will be I with HEP for LE, core for long term health and fitness  Baseline: 12/27/2021 INITIAL  2 Pt will be able to improve foot clearance with gait, Rt LE Baseline: 12/27/2021 INITIAL  3 Pt will feel more steady with walking backwards, balance in general due to Rt ankle stability and control  Baseline: 12/27/2021 INITIAL  4 FOTO score will improve to 73% or more  Baseline: 12/27/2021 INITIAL  5 Pt will be able to improve single leg stance on Rt , holding for 15 sec or more for min dynamic activity  Baseline: 12/27/2021 INITIAL  6 Pt will report pain 3/10 in back , RT LE or less at the end of the work day  Baseline: 12/27/2021 INITIAL             PLAN: PT FREQUENCY: 1x/week   PT DURATION: 8 weeks   PLANNED INTERVENTIONS: Therapeutic exercises, Therapeutic activity, Neuro Muscular re-education, Balance training, Gait training, Patient/Family education, Cryotherapy, Moist heat, and Manual therapy   PLAN FOR NEXT SESSION: Continue to narrow down balance dysfunction, balance and proprioceptive work, R hip/knee strengthening, assess benefit of weighted RLE, encourage RAMs to improve gait and balance, balance tasks from compliant surfaces, strategies to advance R hip and knee through gait cycle, Tressie Stalker, PT 12/19/2021, 2:49 PM

## 2021-12-19 NOTE — Telephone Encounter (Signed)
Pt called me back and we reviewed note. She is asking for an order for a PLSO to be sent to [facility] to help with foot drop and improve balance. ? ?Pt reports she works at [employer] and one of the physician's there had recommended this type of brace. ? ?She also reports when she went to physical therapy, the therapist advised against AFO brace.  ?

## 2021-12-19 NOTE — Telephone Encounter (Signed)
Pt dropped off FYI paper yesterday with front desk about [facility] and the help provided with  PLSD brace. Pt was advised we received her note and if she needed to speak with one of the nurses to call back and we could talk.  ? ?

## 2021-12-20 NOTE — Telephone Encounter (Signed)
I called pt and relayed message from Dr. Teresa Coombs. If pt calls back telephone room staff can relay.  ?

## 2021-12-27 ENCOUNTER — Ambulatory Visit: Payer: Commercial Managed Care - PPO

## 2022-01-02 ENCOUNTER — Ambulatory Visit: Payer: Commercial Managed Care - PPO

## 2022-01-10 ENCOUNTER — Ambulatory Visit: Payer: Commercial Managed Care - PPO

## 2022-01-20 ENCOUNTER — Other Ambulatory Visit: Payer: Self-pay

## 2022-01-20 ENCOUNTER — Emergency Department (HOSPITAL_COMMUNITY): Payer: Commercial Managed Care - PPO

## 2022-01-20 ENCOUNTER — Encounter (HOSPITAL_COMMUNITY): Payer: Self-pay

## 2022-01-20 ENCOUNTER — Emergency Department (HOSPITAL_COMMUNITY)
Admission: EM | Admit: 2022-01-20 | Discharge: 2022-01-21 | Disposition: A | Discharge status: Home / Self Care | Payer: Commercial Managed Care - PPO | Attending: Emergency Medicine | Admitting: Emergency Medicine

## 2022-01-20 DIAGNOSIS — R519 Headache, unspecified: Secondary | ICD-10-CM | POA: Diagnosis present

## 2022-01-20 DIAGNOSIS — G44209 Tension-type headache, unspecified, not intractable: Secondary | ICD-10-CM | POA: Insufficient documentation

## 2022-01-20 DIAGNOSIS — I10 Essential (primary) hypertension: Secondary | ICD-10-CM | POA: Diagnosis not present

## 2022-01-20 LAB — COMPREHENSIVE METABOLIC PANEL
ALT: 21 U/L (ref 0–44)
AST: 20 U/L (ref 15–41)
Albumin: 4.6 g/dL (ref 3.5–5.0)
Alkaline Phosphatase: 77 U/L (ref 38–126)
Anion gap: 13 (ref 5–15)
BUN: 9 mg/dL (ref 8–23)
CO2: 26 mmol/L (ref 22–32)
Calcium: 9.6 mg/dL (ref 8.9–10.3)
Chloride: 100 mmol/L (ref 98–111)
Creatinine, Ser: 0.53 mg/dL (ref 0.44–1.00)
GFR, Estimated: >60 mL/min (ref >60)
Glucose, Bld: 124 mg/dL — ABNORMAL HIGH (ref 70–99)
Potassium: 3.7 mmol/L (ref 3.5–5.1)
Sodium: 139 mmol/L (ref 135–145)
Total Bilirubin: 0.7 mg/dL (ref 0.3–1.2)
Total Protein: 7.7 g/dL (ref 6.5–8.1)

## 2022-01-20 LAB — CBC
HCT: 42.6 % (ref 36.0–46.0)
Hemoglobin: 13.7 g/dL (ref 12.0–15.0)
MCH: 29.5 pg (ref 26.0–34.0)
MCHC: 32.2 g/dL (ref 30.0–36.0)
MCV: 91.6 fL (ref 80.0–100.0)
Platelets: 284 10*3/uL (ref 150–400)
RBC: 4.65 MIL/uL (ref 3.87–5.11)
RDW: 12.4 % (ref 11.5–15.5)
WBC: 7.5 10*3/uL (ref 4.0–10.5)
nRBC: 0 % (ref 0.0–0.2)

## 2022-01-20 LAB — I-STAT CHEM 8, ED
BUN: 9 mg/dL (ref 8–23)
Calcium, Ion: 1.15 mmol/L (ref 1.15–1.40)
Chloride: 103 mmol/L (ref 98–111)
Creatinine, Ser: 0.4 mg/dL — ABNORMAL LOW (ref 0.44–1.00)
Glucose, Bld: 123 mg/dL — ABNORMAL HIGH (ref 70–99)
HCT: 44 % (ref 36.0–46.0)
Hemoglobin: 15 g/dL (ref 12.0–15.0)
Potassium: 3.7 mmol/L (ref 3.5–5.1)
Sodium: 139 mmol/L (ref 135–145)
TCO2: 26 mmol/L (ref 22–32)

## 2022-01-20 LAB — DIFFERENTIAL
Abs Immature Granulocytes: 0.01 10*3/uL (ref 0.00–0.07)
Basophils Absolute: 0.1 10*3/uL (ref 0.0–0.1)
Basophils Relative: 1 %
Eosinophils Absolute: 0 10*3/uL (ref 0.0–0.5)
Eosinophils Relative: 0 %
Immature Granulocytes: 0 %
Lymphocytes Relative: 24 %
Lymphs Abs: 1.8 10*3/uL (ref 0.7–4.0)
Monocytes Absolute: 0.3 10*3/uL (ref 0.1–1.0)
Monocytes Relative: 4 %
Neutro Abs: 5.3 10*3/uL (ref 1.7–7.7)
Neutrophils Relative %: 71 %

## 2022-01-20 LAB — PROTIME-INR
INR: 1 (ref 0.8–1.2)
Prothrombin Time: 12.7 seconds (ref 11.4–15.2)

## 2022-01-20 LAB — CBG MONITORING, ED: Glucose-Capillary: 129 mg/dL — ABNORMAL HIGH (ref 70–99)

## 2022-01-20 LAB — APTT: aPTT: 29 seconds (ref 24–36)

## 2022-01-20 MED ORDER — SODIUM CHLORIDE 0.9% FLUSH: 3.0000 mL | Freq: Once | INTRAVENOUS | Status: DC

## 2022-01-20 NOTE — ED Triage Notes (Signed)
Pt c/o HTN and HA started this morning upon waking. Pt states she feels light headed and vomited three times this morning. Pt states her highest reading was 189/90 at home. ?

## 2022-01-21 MED ORDER — ACETAMINOPHEN 325 MG PO TABS
650.0000 mg | ORAL_TABLET | Freq: Once | ORAL | Status: AC
  Administered 2022-01-21: 650 mg via ORAL
  Filled 2022-01-21: qty 2

## 2022-01-21 MED ORDER — METHOCARBAMOL 500 MG PO TABS: 500.0000 mg | ORAL_TABLET | Freq: Two times a day (BID) | ORAL | 0 refills | Status: DC

## 2022-01-21 MED ORDER — METHOCARBAMOL 500 MG PO TABS
500.0000 mg | ORAL_TABLET | Freq: Once | ORAL | Status: AC
  Administered 2022-01-21: 500 mg via ORAL
  Filled 2022-01-21: qty 1

## 2022-01-21 NOTE — ED Provider Notes (Signed)
? ?[facility] EMERGENCY DEPARTMENT  ?Provider Note ? ?CSN: [id number] ?Arrival date & time: 01/20/22 1745 ? ?History ?Chief Complaint  ?Patient presents with  ? Hypertension  ? ? ?Shawna Suarez is a 61 y.o. female with history of lumbar disease and foot drop reports her back has been hurting her more recently, scheduled for steroid injection in 10 days. She woke up in the morning prior to arrival with moderate to severe aching posterior headache, radiating to the front, improved with APAP. She was feeling dizzy and vomited several times but that has improved. She checked her BP at home and it was elevated. Has not been elevated since arrival here. She still is having some nausea.  ? ? ?Home Medications ?Prior to Admission medications   ?Medication Sig Start Date End Date Taking? Authorizing Provider  ?methocarbamol (ROBAXIN) 500 MG tablet Take 1 tablet (500 mg total) by mouth 2 (two) times daily. 01/21/22  Yes Pollyann Savoy, MD  ?acetaminophen (TYLENOL) 325 MG tablet Take 650 mg by mouth every 6 (six) hours as needed. ?Patient not taking: Reported on 11/15/2021    [provider]  ? ? ? ?Allergies    ?Penicillin g, Aspirin, Prednisone, and Sulfa antibiotics ? ? ?Review of Systems   ?Review of Systems ?Please see HPI for pertinent positives and negatives ? ?Physical Exam ?BP 112/70   Pulse 61   Temp 98.6 ?F (37 ?C) (Oral)   Resp 14   Ht 5\' 3"  (1.6 m)   Wt 69.4 kg   SpO2 95%   BMI 27.10 kg/m?  ? ?Physical Exam ?Vitals and nursing note reviewed.  ?Constitutional:   ?   Appearance: Normal appearance.  ?HENT:  ?   Head: Normocephalic and atraumatic.  ?   Nose: Nose normal.  ?   Mouth/Throat:  ?   Mouth: Mucous membranes are moist.  ?Eyes:  ?   Extraocular Movements: Extraocular movements intact.  ?   Conjunctiva/sclera: Conjunctivae normal.  ?Cardiovascular:  ?   Rate and Rhythm: Normal rate.  ?Pulmonary:  ?   Effort: Pulmonary effort is normal.  ?   Breath sounds: Normal breath  sounds.  ?Abdominal:  ?   General: Abdomen is flat.  ?   Palpations: Abdomen is soft.  ?   Tenderness: There is no abdominal tenderness.  ?Musculoskeletal:     ?   General: No swelling. Normal range of motion.  ?   Cervical back: Neck supple.  ?Skin: ?   General: Skin is warm and dry.  ?Neurological:  ?   Mental Status: She is alert and oriented to person, place, and time.  ?   Cranial Nerves: No cranial nerve deficit.  ?   Coordination: Coordination normal.  ?Psychiatric:     ?   Mood and Affect: Mood normal.  ? ? ?ED Results / Procedures / Treatments   ?EKG ?EKG Interpretation ? ?Date/Time:  Sunday January 20 2022 18:18:02 EDT ?Ventricular Rate:  73 ?PR Interval:  182 ?QRS Duration: 78 ?QT Interval:  350 ?QTC Calculation: 385 ?R Axis:   90 ?Text Interpretation: Normal sinus rhythm Rightward axis T wave abnormality, consider inferior ischemia Abnormal ECG No previous ECGs available Confirmed by 06-08-1995 (Dione Booze) on 01/20/2022 10:38:39 PM ? ?Procedures ?Procedures ? ?Medications Ordered in the ED ?Medications  ?sodium chloride flush (NS) 0.9 % injection 3 mL (has no administration in time range)  ?methocarbamol (ROBAXIN) tablet 500 mg (has no administration in time range)  ?acetaminophen (TYLENOL) tablet  650 mg (has no administration in time range)  ? ? ?Initial Impression and Plan ? Patient here with nonspecific headache and reported HTN at home, has not been hypertensive here but has continued to have some headache. Labs done in triage show normal CBC, CMP, coags. I personally viewed the images from radiology studies and agree with radiologist interpretation: CT head is neg. Suspect she is having tension headaches. She does not tolerate NSAIDs, so will continue APAP, add Robaxin, recommend close outpatient PCP follow up or RTED for any other concerns.  ? ? ?ED Course  ? ?  ? ? ?MDM Rules/Calculators/A&P ?Medical Decision Making ?Given presenting complaint, I considered that admission might be necessary. After  review of results from ED lab and/or imaging studies, admission to the hospital is not indicated at this time.  ? ? ?Problems Addressed: ?Tension headache: acute illness or injury ? ?Amount and/or Complexity of Data Reviewed ?Labs: ordered. Decision-making details documented in ED Course. ?Radiology: ordered and independent interpretation performed. Decision-making details documented in ED Course. ? ?Risk ?Prescription drug management. ?Decision regarding hospitalization. ? ? ? ?Final Clinical Impression(s) / ED Diagnoses ?Final diagnoses:  ?Tension headache  ? ? ?Rx / DC Orders ?ED Discharge Orders   ? ?      Ordered  ?  methocarbamol (ROBAXIN) 500 MG tablet  2 times daily       ? 01/21/22 0149  ? ?  ?  ? ?  ? ?  ?Pollyann Savoy, MD ?01/21/22 0149 ? ?

## 2022-02-13 ENCOUNTER — Encounter: Payer: Self-pay | Admitting: Neurology

## 2022-02-13 ENCOUNTER — Ambulatory Visit (INDEPENDENT_AMBULATORY_CARE_PROVIDER_SITE_OTHER): Payer: Self-pay | Admitting: Neurology

## 2022-02-13 VITALS — BP 123/66 | HR 69 | Ht 63.0 in | Wt 151.6 lb

## 2022-02-13 DIAGNOSIS — G5731 Lesion of lateral popliteal nerve, right lower limb: Secondary | ICD-10-CM

## 2022-02-13 DIAGNOSIS — M21371 Foot drop, right foot: Secondary | ICD-10-CM

## 2022-02-13 DIAGNOSIS — R269 Unspecified abnormalities of gait and mobility: Secondary | ICD-10-CM

## 2022-02-13 DIAGNOSIS — M5416 Radiculopathy, lumbar region: Secondary | ICD-10-CM

## 2022-02-13 DIAGNOSIS — G6289 Other specified polyneuropathies: Secondary | ICD-10-CM

## 2022-02-13 NOTE — Patient Instructions (Addendum)
Neuropathy labs  Continue current medications Patient to contact me after June 1, once her new insurance kicks in for new referral to PT for gait training Follow up in 2 months

## 2022-02-13 NOTE — Progress Notes (Signed)
[facility]  PATIENT: Shawna Suarez DOB: 1961-09-18  REQUESTING CLINICIAN: Tannenbaum, [place], MD HISTORY FROM: Patient  REASON FOR VISIT: Right leg pain and right foot drop    HISTORICAL  CHIEF COMPLAINT:  Chief Complaint  Patient presents with   Follow-up    Rm 17. Accompanied by daughter's MIL, Shawna Suarez. C/o worsening gait. She c/o numbness in the bottom of both feet. She d/c PT due to worsening back issues. She has had 5 fall within the past month. Would like to discuss disability.    INTERVAL HISTORY 02/12/22:  Patient presents today for follow-up, she is accompanied by her daughter's mother-in-law Shawna Suarez.  She reported that she completed physical therapy but it made her back pain worse, now she is having spasm in the back all the way to the neck.  She said her gait is worse due to her right foot drop now the left leg is involved now.  She does use a walker but had fallen 5 times within the past month.  Currently she is on leave at work because she cannot perform her duties due to gait instability.  Right lower extremity still weak.   She reports 1 of the doctor at [facility] has recommended a brace but physical therapist advised against it.  At that time she was still getting physical therapy so I deferred the decision to use of brace to physical therapy.  She still not using a brace in the right lower leg. She is still complaining of bilateral foot numbness which is making her gait worse.    HISTORY OF PRESENT ILLNESS:  This is a 61 year old woman with no reported past medical history who is presenting with complaint of right lower extremity pain, described as shooting pain, and right foot weakness.  Patient said the symptoms started since last September after receiving the second dose of COVID.  Since that she has been having intermittent pain.  She followed with orthopedist, had a lumbar MRI which showed no abnormality other than degenerative disc disease.   She is able to ambulate but stated she has to be very careful, denies any falls.  She reported the pain is located right at the hip joint and radiates behind right leg all the way down to the foot, there is also report of numbness on the bottom of the foot.  Denies back pain.    OTHER MEDICAL CONDITIONS: None    REVIEW OF SYSTEMS: Full 14 system review of systems performed and negative with exception of: noted in the HPI   ALLERGIES: Allergies  Allergen Reactions   Penicillin G Anaphylaxis   Aspirin Other (See Comments)   Codeine     Other reaction(s): Unknown   Prednisone Other (See Comments)   Sulfa Antibiotics Hives    HOME MEDICATIONS: Outpatient Medications Prior to Visit  Medication Sig Dispense Refill   acetaminophen (TYLENOL) 325 MG tablet Take 650 mg by mouth every 6 (six) hours as needed.     gabapentin (NEURONTIN) 100 MG capsule Take 1 capsule by mouth daily.     methocarbamol (ROBAXIN) 500 MG tablet Take 1 tablet (500 mg total) by mouth 2 (two) times daily. 20 tablet 0   No facility-administered medications prior to visit.    PAST MEDICAL HISTORY: Past Medical History:  Diagnosis Date   Arthritis     PAST SURGICAL HISTORY: Past Surgical History:  Procedure Laterality Date   right breast biopsy  2002    FAMILY HISTORY: Family History  Problem  Relation Age of Onset   Lung cancer Mother    Cervical cancer Mother    Throat cancer Father    Prostate cancer Father     SOCIAL HISTORY: Social History   Socioeconomic History   Marital status: Divorced    Spouse name: Not on file   Number of children: Not on file   Years of education: Not on file   Highest education level: Not on file  Occupational History   Not on file  Tobacco Use   Smoking status: Former    Years: 5.00    Types: Cigarettes    Quit date: 71    Years since quitting: 45.4   Smokeless tobacco: Never  Substance and Sexual Activity   Alcohol use: Never   Drug use: Never    Sexual activity: Not on file  Other Topics Concern   Not on file  Social History Narrative   Not on file   Social Determinants of Health   Financial Resource Strain: Not on file  Food Insecurity: Not on file  Transportation Needs: Not on file  Physical Activity: Not on file  Stress: Not on file  Social Connections: Not on file  Intimate Partner Violence: Not on file    PHYSICAL EXAM  GENERAL EXAM/CONSTITUTIONAL: Vitals:  Vitals:   02/13/22 1452  BP: 123/66  Pulse: 69  Weight: 151 lb 9.6 oz (68.8 kg)  Height: 5' 3"  (1.6 m)   Body mass index is 26.85 kg/m. Wt Readings from Last 3 Encounters:  02/13/22 151 lb 9.6 oz (68.8 kg)  01/20/22 153 lb (69.4 kg)  08/15/21 153 lb (69.4 kg)   Patient is in no distress; well developed, nourished and groomed; neck is supple  EYES: Pupils round and reactive to light, Visual fields full to confrontation, Extraocular movements intacts,   MUSCULOSKELETAL: Gait, strength, tone, movements noted in Neurologic exam below  NEUROLOGIC: MENTAL STATUS:      View : No data to display.         awake, alert, oriented to person, place and time recent and remote memory intact normal attention and concentration language fluent, comprehension intact, naming intact fund of knowledge appropriate  CRANIAL NERVE:  2nd, 3rd, 4th, 6th - pupils equal and reactive to light, visual fields full to confrontation, extraocular muscles intact, no nystagmus 5th - facial sensation symmetric 7th - facial strength symmetric 8th - hearing intact 9th - palate elevates symmetrically, uvula midline 11th - shoulder shrug symmetric 12th - tongue protrusion midline  MOTOR:  normal bulk and tone, full strength in the BUE, BLE. There is decrease strength about 4/5 for right foot dorsiflexion and inversion when compared to the left.   SENSORY:  Decrease sensation to vibration and pinprick up to ankle bilateral lower extremities   COORDINATION:   finger-nose-finger, fine finger movements normal  REFLEXES:  deep tendon reflexes present and symmetric  GAIT/STATION:  Wide based, Antalgic gait, walk on her the tip of right foot. Gait appears to be somewhat magnetic    DIAGNOSTIC DATA (LABS, IMAGING, TESTING) - I reviewed patient records, labs, notes, testing and imaging myself where available.  Lab Results  Component Value Date   WBC 7.5 01/20/2022   HGB 15.0 01/20/2022   HCT 44.0 01/20/2022   MCV 91.6 01/20/2022   PLT 284 01/20/2022      Component Value Date/Time   NA 139 01/20/2022 1926   K 3.7 01/20/2022 1926   CL 103 01/20/2022 1926   CO2 26 01/20/2022 1823  GLUCOSE 123 (H) 01/20/2022 1926   BUN 9 01/20/2022 1926   CREATININE 0.40 (L) 01/20/2022 1926   CALCIUM 9.6 01/20/2022 1823   PROT 7.7 01/20/2022 1823   ALBUMIN 4.6 01/20/2022 1823   AST 20 01/20/2022 1823   ALT 21 01/20/2022 1823   ALKPHOS 77 01/20/2022 1823   BILITOT 0.7 01/20/2022 1823   GFRNONAA >60 01/20/2022 1823   No results found for: CHOL, HDL, LDLCALC, LDLDIRECT, TRIG, CHOLHDL No results found for: HGBA1C No results found for: VITAMINB12 No results found for: TSH  MRI Lumbar spine 1.  Mild multilevel lumbar spondylosis with mild to moderate right-sided foraminal stenosis at L3-L4 and L4-L5. 2.  Mild bilateral subarticular recess stenosis at L2-L3, L3 on 4 and L4-5.  No high-grade canal stenosis.   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   ASSESSMENT AND PLAN  61 y.o. year old female with no reported past medical history who is presenting for follow up for right lower radicular pain, right foot drop and right foot numbness.  Patient now reports her symptoms are worse, and involving the left leg.  She does have bilateral lower extremities numbness and worsening weakness of the right leg.  Her gait is affected, currently she uses a walker but has fallen 5 times.  She  does have gait instability noted on exam due to magnetic gait, antalgic gait, walking on toes on the right.  She reports that currently she does not have insurance and her new insurance will start June 1.  I will await for new insurance to be active then I will send patient back to physical therapy for gait training.  I will also do extensive neuropathy work.  She is comfortable with plan.  I will see her in 2 months for follow-up.   1. Other polyneuropathy   2. 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 Lumbar radiculopathy   5. Gait abnormality     Patient Instructions  Neuropathy labs  Continue current medications Patient to contact me after June 1, once her new insurance kicks in for new referral to PT for gait training Follow up in 2 months     Orders Placed This Encounter  Procedures   CBC with diff   CMP   Vitamin B12   MMA   Homocysteine   A1c   TSH   SPEP with IFE   ANA w/Reflex   SSA, SSB   ESR   CRP   Copper, serum    No orders of the defined types were placed in this encounter.    Return in about 2 months (around 04/15/2022).    Alric Ran, MD 02/13/2022, 9:53 PM  [facility] [address] [place], [place] [address] [phone]

## 2022-02-14 NOTE — Progress Notes (Signed)
Hi Michelle, do not forward the results to the work in MD. I will take care of it when I get back. Thank you

## 2022-02-19 LAB — CBC WITH DIFFERENTIAL/PLATELET
Basophils Absolute: 0.1 10*3/uL (ref 0.0–0.2)
Basos: 1 %
EOS (ABSOLUTE): 0.1 10*3/uL (ref 0.0–0.4)
Eos: 0 %
Hematocrit: 42.5 % (ref 34.0–46.6)
Hemoglobin: 13.9 g/dL (ref 11.1–15.9)
Immature Grans (Abs): 0 10*3/uL (ref 0.0–0.1)
Immature Granulocytes: 0 %
Lymphocytes Absolute: 2.6 10*3/uL (ref 0.7–3.1)
Lymphs: 21 %
MCH: 29.2 pg (ref 26.6–33.0)
MCHC: 32.7 g/dL (ref 31.5–35.7)
MCV: 89 fL (ref 79–97)
Monocytes Absolute: 0.6 10*3/uL (ref 0.1–0.9)
Monocytes: 5 %
Neutrophils Absolute: 8.8 10*3/uL — ABNORMAL HIGH (ref 1.4–7.0)
Neutrophils: 73 %
Platelets: 295 10*3/uL (ref 150–450)
RBC: 4.76 x10E6/uL (ref 3.77–5.28)
RDW: 12.3 % (ref 11.7–15.4)
WBC: 12.1 10*3/uL — ABNORMAL HIGH (ref 3.4–10.8)

## 2022-02-19 LAB — COMPREHENSIVE METABOLIC PANEL
ALT: 20 IU/L (ref 0–32)
AST: 18 IU/L (ref 0–40)
Albumin/Globulin Ratio: 2.7 — ABNORMAL HIGH (ref 1.2–2.2)
Albumin: 5.2 g/dL — ABNORMAL HIGH (ref 3.8–4.8)
Alkaline Phosphatase: 88 IU/L (ref 44–121)
BUN/Creatinine Ratio: 14 (ref 12–28)
BUN: 8 mg/dL (ref 8–27)
Bilirubin Total: <0.2 mg/dL (ref 0.0–1.2)
CO2: 24 mmol/L (ref 20–29)
Calcium: 9.6 mg/dL (ref 8.7–10.3)
Chloride: 100 mmol/L (ref 96–106)
Creatinine, Ser: 0.56 mg/dL — ABNORMAL LOW (ref 0.57–1.00)
Globulin, Total: 1.9 g/dL (ref 1.5–4.5)
Glucose: 94 mg/dL (ref 70–99)
Potassium: 4.1 mmol/L (ref 3.5–5.2)
Sodium: 140 mmol/L (ref 134–144)
Total Protein: 7.1 g/dL (ref 6.0–8.5)
eGFR: 103 mL/min/{1.73_m2} (ref >59)

## 2022-02-19 LAB — MULTIPLE MYELOMA PANEL, SERUM
Albumin SerPl Elph-Mcnc: 4.2 g/dL (ref 2.9–4.4)
Albumin/Glob SerPl: 1.5 (ref 0.7–1.7)
Alpha 1: 0.2 g/dL (ref 0.0–0.4)
Alpha2 Glob SerPl Elph-Mcnc: 0.7 g/dL (ref 0.4–1.0)
B-Globulin SerPl Elph-Mcnc: 1.1 g/dL (ref 0.7–1.3)
Gamma Glob SerPl Elph-Mcnc: 0.9 g/dL (ref 0.4–1.8)
Globulin, Total: 2.9 g/dL (ref 2.2–3.9)
IgA/Immunoglobulin A, Serum: 111 mg/dL (ref 87–352)
IgG (Immunoglobin G), Serum: 872 mg/dL (ref 586–1602)
IgM (Immunoglobulin M), Srm: 45 mg/dL (ref 26–217)

## 2022-02-19 LAB — HEMOGLOBIN A1C
Est. average glucose Bld gHb Est-mCnc: 123 mg/dL
Hgb A1c MFr Bld: 5.9 % — ABNORMAL HIGH (ref 4.8–5.6)

## 2022-02-19 LAB — SJOGREN'S SYNDROME ANTIBODS(SSA + SSB)
ENA SSA (RO) Ab: <0.2 AI (ref 0.0–0.9)
ENA SSB (LA) Ab: <0.2 AI (ref 0.0–0.9)

## 2022-02-19 LAB — COPPER, SERUM: Copper: 120 ug/dL (ref 80–158)

## 2022-02-19 LAB — VITAMIN B12: Vitamin B-12: 1305 pg/mL — ABNORMAL HIGH (ref 232–1245)

## 2022-02-19 LAB — SEDIMENTATION RATE: Sed Rate: 20 mm/hr (ref 0–40)

## 2022-02-19 LAB — ANA W/REFLEX: ANA Titer 1: NEGATIVE

## 2022-02-19 LAB — METHYLMALONIC ACID, SERUM: Methylmalonic Acid: 73 nmol/L (ref 0–378)

## 2022-02-19 LAB — TSH: TSH: 2.24 u[IU]/mL (ref 0.450–4.500)

## 2022-02-19 LAB — C-REACTIVE PROTEIN: CRP: <1 mg/L (ref 0–10)

## 2022-02-19 LAB — HOMOCYSTEINE: Homocysteine: 7.7 umol/L (ref 0.0–17.2)

## 2022-02-25 NOTE — Progress Notes (Signed)
Discussed with patient all labs results, informed her that labs does not show a cause of neuropathy. I wanted to send her to PT but she has a $4000.00 deductible that she cannot afford right now. She continues to do exercise at home and walking a lot more.

## 2022-02-28 ENCOUNTER — Telehealth: Payer: Self-pay | Admitting: Neurology

## 2022-02-28 ENCOUNTER — Telehealth: Payer: Self-pay

## 2022-02-28 DIAGNOSIS — G5731 Lesion of lateral popliteal nerve, right lower limb: Secondary | ICD-10-CM

## 2022-02-28 DIAGNOSIS — M5416 Radiculopathy, lumbar region: Secondary | ICD-10-CM

## 2022-02-28 DIAGNOSIS — G6289 Other specified polyneuropathies: Secondary | ICD-10-CM

## 2022-02-28 DIAGNOSIS — R269 Unspecified abnormalities of gait and mobility: Secondary | ICD-10-CM

## 2022-02-28 DIAGNOSIS — Z0289 Encounter for other administrative examinations: Secondary | ICD-10-CM

## 2022-02-28 DIAGNOSIS — M21371 Foot drop, right foot: Secondary | ICD-10-CM

## 2022-02-28 NOTE — Telephone Encounter (Signed)
Done. Thank you.

## 2022-02-28 NOTE — Telephone Encounter (Signed)
Referral for Physical Therapy sent [facility] [phone].

## 2022-02-28 NOTE — Telephone Encounter (Addendum)
MD has reviewed paperwork and agreed   Copies provided to medical records for processing.

## 2022-02-28 NOTE — Telephone Encounter (Signed)
Received short term disability pw from [employer] on this pt.   She is requesting time out of work from 02/13/2022- 05/24/2022. Paper work has been completed and placed on MD's desk for review and signature if appropriate.   She confirmed her new insurance took effect on 6/1 and can proceed with Physical therapy and gait training as previously planned.

## 2022-03-11 ENCOUNTER — Other Ambulatory Visit: Payer: Self-pay | Admitting: Neurology

## 2022-03-11 NOTE — Telephone Encounter (Signed)
According to Dr. Karie Georges last office visit note we do not provide any facility-administered medications to the patient.  I spoke with the patient regarding medication refill. She stated during the last office visit Dr. Teresa Coombs advised her to increase gabapentin (Neurontin) 100 mg capsule daily to 1 capsule at night and 2 in the morning. Previously her gabapentin had been filled by Evangeline Dakin, PA. She contacted her office for a refill of Gabapentin and was advised to obtain refills from Dr. Teresa Coombs due to dosage increase.

## 2022-03-11 NOTE — Telephone Encounter (Signed)
Pt request refill for gabapentin (NEURONTIN) 100 MG capsule at [facility] [phone]

## 2022-03-11 NOTE — Telephone Encounter (Signed)
Pt has called for an update re: her request for an increase in her Rx for the  gabapentin  , Pt states she is to take 3 a day.  Pt was advised that she has to give provider time to look at her request.

## 2022-03-12 ENCOUNTER — Telehealth: Payer: Self-pay | Admitting: Neurology

## 2022-03-12 MED ORDER — GABAPENTIN 100 MG PO CAPS
ORAL_CAPSULE | ORAL | 0 refills | Status: DC
Start: 2022-03-12 — End: 2022-04-08

## 2022-03-12 NOTE — Telephone Encounter (Signed)
I spoke to the patient. States at her last appt, Dr. Teresa Coombs instructed her to increase her gabapentin 100mg  to one capsule in am and two caps in pm.

## 2022-03-12 NOTE — Telephone Encounter (Signed)
Pt requesting a call from the nurse to discuss refill for Gabapentin.

## 2022-03-12 NOTE — Telephone Encounter (Signed)
Rx sent, thanks 

## 2022-04-08 ENCOUNTER — Ambulatory Visit (INDEPENDENT_AMBULATORY_CARE_PROVIDER_SITE_OTHER): Payer: No Typology Code available for payment source | Admitting: Neurology

## 2022-04-08 ENCOUNTER — Encounter: Payer: Self-pay | Admitting: Neurology

## 2022-04-08 VITALS — BP 112/71 | HR 80 | Ht 63.0 in | Wt 151.0 lb

## 2022-04-08 DIAGNOSIS — R269 Unspecified abnormalities of gait and mobility: Secondary | ICD-10-CM | POA: Diagnosis not present

## 2022-04-08 DIAGNOSIS — G5731 Lesion of lateral popliteal nerve, right lower limb: Secondary | ICD-10-CM

## 2022-04-08 DIAGNOSIS — M5416 Radiculopathy, lumbar region: Secondary | ICD-10-CM | POA: Diagnosis not present

## 2022-04-08 DIAGNOSIS — M21371 Foot drop, right foot: Secondary | ICD-10-CM

## 2022-04-08 MED ORDER — METHOCARBAMOL 500 MG PO TABS: 750.0000 mg | ORAL_TABLET | Freq: Two times a day (BID) | ORAL | 3 refills | Status: DC

## 2022-04-08 MED ORDER — GABAPENTIN 100 MG PO CAPS: ORAL_CAPSULE | ORAL | 3 refills | Status: DC

## 2022-04-08 NOTE — Progress Notes (Signed)
[facility]  PATIENT: Shawna Suarez DOB: 06/22/1961  REQUESTING CLINICIAN: Hyacinth Meeker, [place], [place] HISTORY FROM: Patient  REASON FOR VISIT: Right leg pain and right foot drop    HISTORICAL  CHIEF COMPLAINT:  Chief Complaint  Patient presents with   Follow-up    Rm 13, alone Here to discuss medications,    INTERVAL HISTORY 04/08/22: Patient presents today for follow-up, last visit plan was to restart physical therapy, she reports that she defer PT because it will cost her more than $4000 out to pocket therefore has not completed.  She denies any fall since last visit.  She is doing the physical therapy work exercise at home and uses a cane.  She still trying to work with her employer to obtain disability. She reports that Gabapentin and Robaxin has helped and needs referral.    INTERVAL HISTORY 02/12/22:  Patient presents today for follow-up, she is accompanied by her daughter's mother-in-law Darl Pikes.  She reported that she completed physical therapy but it made her back pain worse, now she is having spasm in the back all the way to the neck.  She said her gait is worse due to her right foot drop now the left leg is involved now.  She does use a walker but had fallen 5 times within the past month.  Currently she is on leave at work because she cannot perform her duties due to gait instability.  Right lower extremity still weak.   She reports 1 of the doctor at [facility] has recommended a brace but physical therapist advised against it.  At that time she was still getting physical therapy so I deferred the decision to use of brace to physical therapy.  She still not using a brace in the right lower leg. She is still complaining of bilateral foot numbness which is making her gait worse.    HISTORY OF PRESENT ILLNESS:  This is a 61 year old woman with no reported past medical history who is presenting with complaint of right lower extremity pain, described as  shooting pain, and right foot weakness.  Patient said the symptoms started since last September after receiving the second dose of COVID.  Since that she has been having intermittent pain.  She followed with orthopedist, had a lumbar MRI which showed no abnormality other than degenerative disc disease.  She is able to ambulate but stated she has to be very careful, denies any falls.  She reported the pain is located right at the hip joint and radiates behind right leg all the way down to the foot, there is also report of numbness on the bottom of the foot.  Denies back pain.    OTHER MEDICAL CONDITIONS: None    REVIEW OF SYSTEMS: Full 14 system review of systems performed and negative with exception of: noted in the HPI   ALLERGIES: Allergies  Allergen Reactions   Penicillin G Anaphylaxis   Aspirin Other (See Comments)   Codeine     Other reaction(s): Unknown   Prednisone Other (See Comments)   Sulfa Antibiotics Hives    HOME MEDICATIONS: Outpatient Medications Prior to Visit  Medication Sig Dispense Refill   acetaminophen (TYLENOL) 325 MG tablet Take 650 mg by mouth every 6 (six) hours as needed.     gabapentin (NEURONTIN) 100 MG capsule Take one capsule in am and two capsules in pm. (Patient taking differently: 300 mg. Pt taking 300 mg) 270 capsule 0   methocarbamol (ROBAXIN) 500 MG tablet Take  1 tablet (500 mg total) by mouth 2 (two) times daily. 20 tablet 0   No facility-administered medications prior to visit.    PAST MEDICAL HISTORY: Past Medical History:  Diagnosis Date   Arthritis     PAST SURGICAL HISTORY: Past Surgical History:  Procedure Laterality Date   right breast biopsy  2002    FAMILY HISTORY: Family History  Problem Relation Age of Onset   Lung cancer Mother    Cervical cancer Mother    Throat cancer Father    Prostate cancer Father     SOCIAL HISTORY: Social History   Socioeconomic History   Marital status: Divorced    Spouse name: Not on file    Number of children: Not on file   Years of education: Not on file   Highest education level: Not on file  Occupational History   Not on file  Tobacco Use   Smoking status: Former    Years: 5.00    Types: Cigarettes    Quit date: 1978    Years since quitting: 45.5   Smokeless tobacco: Never  Substance and Sexual Activity   Alcohol use: Never   Drug use: Never   Sexual activity: Not on file  Other Topics Concern   Not on file  Social History Narrative   Not on file   Social Determinants of Health   Financial Resource Strain: Not on file  Food Insecurity: Not on file  Transportation Needs: Not on file  Physical Activity: Not on file  Stress: Not on file  Social Connections: Not on file  Intimate Partner Violence: Not on file    PHYSICAL EXAM  GENERAL EXAM/CONSTITUTIONAL: Vitals:  Vitals:   04/08/22 1332  BP: 112/71  Pulse: 80  Weight: 151 lb (68.5 kg)  Height: 5\' 3"  (1.6 m)   Body mass index is 26.75 kg/m. Wt Readings from Last 3 Encounters:  04/08/22 151 lb (68.5 kg)  02/13/22 151 lb 9.6 oz (68.8 kg)  01/20/22 153 lb (69.4 kg)   Patient is in no distress; well developed, nourished and groomed; neck is supple  EYES: Pupils round and reactive to light, Visual fields full to confrontation, Extraocular movements intacts,   MUSCULOSKELETAL: Gait, strength, tone, movements noted in Neurologic exam below  NEUROLOGIC: MENTAL STATUS:      No data to display         awake, alert, oriented to person, place and time recent and remote memory intact normal attention and concentration language fluent, comprehension intact, naming intact fund of knowledge appropriate  CRANIAL NERVE:  2nd, 3rd, 4th, 6th - pupils equal and reactive to light, visual fields full to confrontation, extraocular muscles intact, no nystagmus 5th - facial sensation symmetric 7th - facial strength symmetric 8th - hearing intact 9th - palate elevates symmetrically, uvula  midline 11th - shoulder shrug symmetric 12th - tongue protrusion midline  MOTOR:  normal bulk and tone, full strength in the BUE, BLE. There is decrease strength about 4/5 for right foot dorsiflexion and inversion when compared to the left.   SENSORY:  Decrease sensation to vibration and pinprick up to ankle bilateral lower extremities   COORDINATION:  finger-nose-finger, fine finger movements normal  REFLEXES:  deep tendon reflexes present and symmetric  GAIT/STATION:  Wide based, Antalgic gait, walk on her the tip of right foot. Gait appears to be somewhat magnetic. Uses a walker    DIAGNOSTIC DATA (LABS, IMAGING, TESTING) - I reviewed patient records, labs, notes, testing and imaging myself  where available.  Lab Results  Component Value Date   WBC 12.1 (H) 02/13/2022   HGB 13.9 02/13/2022   HCT 42.5 02/13/2022   MCV 89 02/13/2022   PLT 295 02/13/2022      Component Value Date/Time   NA 140 02/13/2022 1542   K 4.1 02/13/2022 1542   CL 100 02/13/2022 1542   CO2 24 02/13/2022 1542   GLUCOSE 94 02/13/2022 1542   GLUCOSE 123 (H) 01/20/2022 1926   BUN 8 02/13/2022 1542   CREATININE 0.56 (L) 02/13/2022 1542   CALCIUM 9.6 02/13/2022 1542   PROT 7.1 02/13/2022 1542   ALBUMIN 5.2 (H) 02/13/2022 1542   AST 18 02/13/2022 1542   ALT 20 02/13/2022 1542   ALKPHOS 88 02/13/2022 1542   BILITOT <0.2 02/13/2022 1542   GFRNONAA >60 01/20/2022 1823   No results found for: "CHOL", "HDL", "LDLCALC", "LDLDIRECT", "TRIG", "CHOLHDL" Lab Results  Component Value Date   HGBA1C 5.9 (H) 02/13/2022   Lab Results  Component Value Date   VITAMINB12 1,305 (H) 02/13/2022   Lab Results  Component Value Date   TSH 2.240 02/13/2022    MRI Lumbar spine 1.  Mild multilevel lumbar spondylosis with mild to moderate right-sided foraminal stenosis at L3-L4 and L4-L5. 2.  Mild bilateral subarticular recess stenosis at L2-L3, L3 on 4 and L4-5.  No high-grade canal stenosis.   EMG/NCS  10/04/21 EMG/NCS consistent with moderately-severe right-sided peroneal neuropathy across the fibular head.   ASSESSMENT AND PLAN  61 y.o. year old female with no reported past medical history who is presenting for follow up for right lower radicular pain, right foot drop and right foot numbness.  Reports that symptoms are the same, gabapentin and Robaxin helped. Currently she is using a walker, had falls previously in the past but none since started using the walker. Defer PT due to cost.  Will continue with Gabapentin and Robaxin and continue to follow up in 3 months or sooner if worse.    1. 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 Lumbar radiculopathy   4. Gait abnormality     Patient Instructions  Continue with Gabapentin 100/200 mg  Continue with Robaxin as needed  Continue your other medications  Continue with home exercises  Use the walker  Follow up in 3 months or sooner if worse.     No orders of the defined types were placed in this encounter.   Meds ordered this encounter  Medications   gabapentin (NEURONTIN) 100 MG capsule    Sig: Take 1 capsule (100 mg total) by mouth in the morning AND 2 capsules (200 mg total) at bedtime. Take one capsule in am and two capsules in pm..    Dispense:  270 capsule    Refill:  3   methocarbamol (ROBAXIN) 500 MG tablet    Sig: Take 1.5 tablets (750 mg total) by mouth 2 (two) times daily.    Dispense:  270 tablet    Refill:  3     Return in about 3 months (around 07/09/2022).  I have spent a total of 30 minutes dedicated to this patient today, preparing to see patient, performing a medically appropriate examination and evaluation, ordering tests and/or medications and procedures, and counseling and educating the patient/family/caregiver; independently interpreting result and communicating results to the family/patient/caregiver; and documenting clinical information in the electronic medical  record.   Windell Norfolk, MD 04/08/2022, 5:07 PM  [facility]  [address] [place], [place] [address] [phone]

## 2022-04-08 NOTE — Patient Instructions (Signed)
Continue with Gabapentin 100/200 mg  Continue with Robaxin as needed  Continue your other medications  Continue with home exercises  Use the walker  Follow up in 3 months or sooner if worse.

## 2022-05-20 ENCOUNTER — Telehealth: Payer: Self-pay | Admitting: Neurology

## 2022-05-20 MED ORDER — METHOCARBAMOL 500 MG PO TABS: 750.0000 mg | ORAL_TABLET | Freq: Two times a day (BID) | ORAL | 3 refills | Status: DC

## 2022-05-20 NOTE — Telephone Encounter (Signed)
Pt request refill for methocarbamol (ROBAXIN) 500 MG tablet at [facility] [phone]

## 2022-05-20 NOTE — Telephone Encounter (Signed)
Refill has been sent as requested.

## 2022-05-28 ENCOUNTER — Telehealth: Payer: Self-pay | Admitting: Neurology

## 2022-05-28 NOTE — Telephone Encounter (Signed)
I called to inform the patient that she has refills on file. She verbalized understanding and expressed appreciation for the call.

## 2022-05-28 NOTE — Telephone Encounter (Signed)
Pt request refill for gabapentin (NEURONTIN) 100 MG capsule at [facility] [phone]

## 2022-07-09 ENCOUNTER — Encounter: Payer: Self-pay | Admitting: Neurology

## 2022-07-09 ENCOUNTER — Ambulatory Visit: Payer: Medicaid Other | Admitting: Neurology

## 2022-07-09 VITALS — BP 146/77 | HR 69 | Ht 63.0 in | Wt 169.1 lb

## 2022-07-09 DIAGNOSIS — R269 Unspecified abnormalities of gait and mobility: Secondary | ICD-10-CM | POA: Diagnosis not present

## 2022-07-09 DIAGNOSIS — M5416 Radiculopathy, lumbar region: Secondary | ICD-10-CM | POA: Diagnosis not present

## 2022-07-09 DIAGNOSIS — G5731 Lesion of lateral popliteal nerve, right lower limb: Secondary | ICD-10-CM | POA: Diagnosis not present

## 2022-07-09 DIAGNOSIS — M21371 Foot drop, right foot: Secondary | ICD-10-CM | POA: Diagnosis not present

## 2022-07-09 NOTE — Progress Notes (Signed)
[facility]  PATIENT: Shawna Suarez DOB: 04-09-1961  REQUESTING CLINICIAN: Sabra Heck, [place], [place] HISTORY FROM: Patient  REASON FOR VISIT: Right leg pain and right foot drop    HISTORICAL  CHIEF COMPLAINT:  Chief Complaint  Patient presents with   Follow-up    Rm 17 alone here for 3 month f/u.    INTERVAL HISTORY 07/09/2022 Shawna Suarez presented for follow-up, she continues to have issues with ambulation, uses walker.  Reported she continue to do exercise at home and there is mild improvement of the right foot.  She has upcoming disability evaluation at the end of the month.  Lately for her she was able to get Medicare. Continue to report falls but no injuries.   INTERVAL HISTORY 04/08/22: Patient presents today for follow-up, last visit plan was to restart physical therapy, she reports that she defer PT because it will cost her more than $4000 out to pocket therefore has not completed.  She denies any fall since last visit.  She is doing the physical therapy work exercise at home and uses a cane.  She still trying to work with her employer to obtain disability. She reports that Gabapentin and Robaxin has helped and needs referral.    INTERVAL HISTORY 02/12/22:  Patient presents today for follow-up, she is accompanied by her daughter's mother-in-law Manuela Schwartz.  She reported that she completed physical therapy but it made her back pain worse, now she is having spasm in the back all the way to the neck.  She said her gait is worse due to her right foot drop now the left leg is involved now.  She does use a walker but had fallen 5 times within the past month.  Currently she is on leave at work because she cannot perform her duties due to gait instability.  Right lower extremity still weak.   She reports 1 of the doctor at [facility] has recommended a brace but physical therapist advised against it.  At that time she was still getting physical therapy so I deferred the  decision to use of brace to physical therapy.  She still not using a brace in the right lower leg. She is still complaining of bilateral foot numbness which is making her gait worse.    HISTORY OF PRESENT ILLNESS:  This is a 61 year old woman with no reported past medical history who is presenting with complaint of right lower extremity pain, described as shooting pain, and right foot weakness.  Patient said the symptoms started since last September after receiving the second dose of COVID.  Since that she has been having intermittent pain.  She followed with orthopedist, had a lumbar MRI which showed no abnormality other than degenerative disc disease.  She is able to ambulate but stated she has to be very careful, denies any falls.  She reported the pain is located right at the hip joint and radiates behind right leg all the way down to the foot, there is also report of numbness on the bottom of the foot.  Denies back pain.    OTHER MEDICAL CONDITIONS: None    REVIEW OF SYSTEMS: Full 14 system review of systems performed and negative with exception of: noted in the HPI   ALLERGIES: Allergies  Allergen Reactions   Penicillin G Anaphylaxis   Aspirin Other (See Comments)   Codeine     Other reaction(s): Unknown   Prednisone Other (See Comments)   Sulfa Antibiotics Hives    HOME MEDICATIONS: Outpatient  Medications Prior to Visit  Medication Sig Dispense Refill   acetaminophen (TYLENOL) 325 MG tablet Take 650 mg by mouth every 6 (six) hours as needed.     gabapentin (NEURONTIN) 100 MG capsule Take 1 capsule (100 mg total) by mouth in the morning AND 2 capsules (200 mg total) at bedtime. Take one capsule in am and two capsules in pm.. 270 capsule 3   methocarbamol (ROBAXIN) 500 MG tablet Take 1.5 tablets (750 mg total) by mouth 2 (two) times daily. 270 tablet 3   No facility-administered medications prior to visit.    PAST MEDICAL HISTORY: Past Medical History:  Diagnosis Date    Arthritis     PAST SURGICAL HISTORY: Past Surgical History:  Procedure Laterality Date   right breast biopsy  2002    FAMILY HISTORY: Family History  Problem Relation Age of Onset   Lung cancer Mother    Cervical cancer Mother    Throat cancer Father    Prostate cancer Father     SOCIAL HISTORY: Social History   Socioeconomic History   Marital status: Divorced    Spouse name: Not on file   Number of children: Not on file   Years of education: Not on file   Highest education level: Not on file  Occupational History   Not on file  Tobacco Use   Smoking status: Former    Years: 5.00    Types: Cigarettes    Quit date: 1978    Years since quitting: 45.8   Smokeless tobacco: Never  Substance and Sexual Activity   Alcohol use: Never   Drug use: Never   Sexual activity: Not on file  Other Topics Concern   Not on file  Social History Narrative   Not on file   Social Determinants of Health   Financial Resource Strain: Not on file  Food Insecurity: Not on file  Transportation Needs: Not on file  Physical Activity: Not on file  Stress: Not on file  Social Connections: Not on file  Intimate Partner Violence: Not on file    PHYSICAL EXAM  GENERAL EXAM/CONSTITUTIONAL: Vitals:  Vitals:   07/09/22 1440  BP: (!) 146/77  Pulse: 69  Weight: 169 lb 1 oz (76.7 kg)  Height: 5\' 3"  (1.6 m)   Body mass index is 29.95 kg/m. Wt Readings from Last 3 Encounters:  07/09/22 169 lb 1 oz (76.7 kg)  04/08/22 151 lb (68.5 kg)  02/13/22 151 lb 9.6 oz (68.8 kg)   Patient is in no distress; well developed, nourished and groomed; neck is supple  EYES: Pupils round and reactive to light, Visual fields full to confrontation, Extraocular movements intacts,   MUSCULOSKELETAL: Gait, strength, tone, movements noted in Neurologic exam below  NEUROLOGIC: MENTAL STATUS:      No data to display         awake, alert, oriented to person, place and time recent and remote memory  intact normal attention and concentration language fluent, comprehension intact, naming intact fund of knowledge appropriate  CRANIAL NERVE:  2nd, 3rd, 4th, 6th - pupils equal and reactive to light, visual fields full to confrontation, extraocular muscles intact, no nystagmus 5th - facial sensation symmetric 7th - facial strength symmetric 8th - hearing intact 9th - palate elevates symmetrically, uvula midline 11th - shoulder shrug symmetric 12th - tongue protrusion midline  MOTOR:  normal bulk and tone, full strength in the BUE, BLE. There is decrease strength about 4/5 for right foot dorsiflexion and inversion when  compared to the left.   SENSORY:  Decrease sensation to vibration and pinprick up to ankle bilateral lower extremities   COORDINATION:  finger-nose-finger, fine finger movements normal  REFLEXES:  deep tendon reflexes present and symmetric  GAIT/STATION:  Wide based, Antalgic gait, walk on her the tip of right foot. Gait appears to be somewhat magnetic. Uses a walker    DIAGNOSTIC DATA (LABS, IMAGING, TESTING) - I reviewed patient records, labs, notes, testing and imaging myself where available.  Lab Results  Component Value Date   WBC 12.1 (H) 02/13/2022   HGB 13.9 02/13/2022   HCT 42.5 02/13/2022   MCV 89 02/13/2022   PLT 295 02/13/2022      Component Value Date/Time   NA 140 02/13/2022 1542   K 4.1 02/13/2022 1542   CL 100 02/13/2022 1542   CO2 24 02/13/2022 1542   GLUCOSE 94 02/13/2022 1542   GLUCOSE 123 (H) 01/20/2022 1926   BUN 8 02/13/2022 1542   CREATININE 0.56 (L) 02/13/2022 1542   CALCIUM 9.6 02/13/2022 1542   PROT 7.1 02/13/2022 1542   ALBUMIN 5.2 (H) 02/13/2022 1542   AST 18 02/13/2022 1542   ALT 20 02/13/2022 1542   ALKPHOS 88 02/13/2022 1542   BILITOT <0.2 02/13/2022 1542   GFRNONAA >60 01/20/2022 1823   No results found for: "CHOL", "HDL", "LDLCALC", "LDLDIRECT", "TRIG", "CHOLHDL" Lab Results  Component Value Date   HGBA1C 5.9  (H) 02/13/2022   Lab Results  Component Value Date   VITAMINB12 1,305 (H) 02/13/2022   Lab Results  Component Value Date   TSH 2.240 02/13/2022   Head CT 01/20/22 No acute intracranial findings are seen in the noncontrast CT brain. Atrophy.  MRI Lumbar spine 05/21/21 1.  Mild multilevel lumbar spondylosis with mild to moderate right-sided foraminal stenosis at L3-L4 and L4-L5. 2.  Mild bilateral subarticular recess stenosis at L2-L3, L3 on 4 and L4-5.  No high-grade canal stenosis.   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   ASSESSMENT AND PLAN  61 y.o. year old female with no reported past medical history who is presenting for follow up for right lower radicular pain, right foot drop and right foot numbness.  Reports that symptoms are the same, gabapentin and Robaxin helped. Currently she is using a walker, had falls previously in the past but none since started using the walker.  Will likely start PT soon since she has insurance, but will defer until after her evaluation by disability doctor.  Will continue with Gabapentin and Robaxin and continue to follow up in 3 months or sooner if worse.    1. 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 Gait abnormality   4. Lumbar radiculopathy      Patient Instructions  Continue current medications Follow-up with your disability evaluation Contact us after you complete your disability evaluation, at that time we will refer you to physical therapy Follow-up in 6 months or sooner if worse     No orders of the defined types were placed in this encounter.   No orders of the defined types were placed in this encounter.    Return in about 6 months (around 01/08/2023).  I have spent a total of 30 minutes dedicated to this patient today, preparing to see patient, performing a medically appropriate examination and evaluation, ordering  tests and/or medications and procedures, and counseling and educating the patient/family/caregiver; independently interpreting result  and communicating results to the family/patient/caregiver; and documenting clinical information in the electronic medical record.   Windell Norfolk, MD 07/09/2022, 4:45 PM  [facility] [address], [address] [place], [place] [address] [phone]

## 2022-07-09 NOTE — Patient Instructions (Signed)
Continue current medications Follow-up with your disability evaluation Contact us after you complete your disability evaluation, at that time we will refer you to physical therapy Follow-up in 6 months or sooner if worse

## 2022-12-25 ENCOUNTER — Encounter: Payer: Self-pay | Admitting: Neurology

## 2022-12-25 ENCOUNTER — Ambulatory Visit: Payer: Medicaid Other | Admitting: Neurology

## 2022-12-25 VITALS — BP 133/66 | HR 73 | Ht 63.0 in | Wt 177.0 lb

## 2022-12-25 DIAGNOSIS — G5731 Lesion of lateral popliteal nerve, right lower limb: Secondary | ICD-10-CM

## 2022-12-25 DIAGNOSIS — Z13228 Encounter for screening for other metabolic disorders: Secondary | ICD-10-CM | POA: Insufficient documentation

## 2022-12-25 DIAGNOSIS — M21371 Foot drop, right foot: Secondary | ICD-10-CM

## 2022-12-25 NOTE — Progress Notes (Signed)
[facility]  PATIENT: Shawna Suarez DOB: 1961-04-22  REQUESTING CLINICIAN: Hyacinth Meeker, [place], [place] HISTORY FROM: Patient  REASON FOR VISIT: Right leg pain and right foot drop    HISTORICAL  CHIEF COMPLAINT:  Chief Complaint  Patient presents with   Follow-up    Rm 15. Accompanied by daughter. She denies any improvement in her condition.   INTERVAL HISTORY 12/25/2022 Shawna Suarez presents today for follow-up, last visit was in October.  Since then she reports her situation is the same.  She was unfortunately unable to get full coverage insurance, therefore could not go to physical therapy and could not get her ankle brace.  She does use a walker with ambulation but still has fall.  Denies any worsening of the pain or weakness but with right foot drop, she still has she still have gait instability.  She was told that she will be approved for full Medicare 2025..   INTERVAL HISTORY 07/09/2022 Shawna Suarez presented for follow-up, she continues to have issues with ambulation, uses walker.  Reported she continue to do exercise at home and there is mild improvement of the right foot.  She has upcoming disability evaluation at the end of the month.  Lately for her she was able to get Medicare. Continue to report falls but no injuries.   INTERVAL HISTORY 04/08/22: Patient presents today for follow-up, last visit plan was to restart physical therapy, she reports that she defer PT because it will cost her more than $4000 out to pocket therefore has not completed.  She denies any fall since last visit.  She is doing the physical therapy work exercise at home and uses a cane.  She still trying to work with her employer to obtain disability. She reports that Gabapentin and Robaxin has helped and needs referral.    INTERVAL HISTORY 02/12/22:  Patient presents today for follow-up, she is accompanied by her daughter's mother-in-law Darl Pikes.  She reported that she completed physical therapy but it  made her back pain worse, now she is having spasm in the back all the way to the neck.  She said her gait is worse due to her right foot drop now the left leg is involved now.  She does use a walker but had fallen 5 times within the past month.  Currently she is on leave at work because she cannot perform her duties due to gait instability.  Right lower extremity still weak.   She reports 1 of the doctor at [facility] has recommended a brace but physical therapist advised against it.  At that time she was still getting physical therapy so I deferred the decision to use of brace to physical therapy.  She still not using a brace in the right lower leg. She is still complaining of bilateral foot numbness which is making her gait worse.    HISTORY OF PRESENT ILLNESS:  This is a 62 year old woman with no reported past medical history who is presenting with complaint of right lower extremity pain, described as shooting pain, and right foot weakness.  Patient said the symptoms started since last September after receiving the second dose of COVID.  Since that she has been having intermittent pain.  She followed with orthopedist, had a lumbar MRI which showed no abnormality other than degenerative disc disease.  She is able to ambulate but stated she has to be very careful, denies any falls.  She reported the pain is located right at the hip joint and radiates behind  right leg all the way down to the foot, there is also report of numbness on the bottom of the foot.  Denies back pain.    OTHER MEDICAL CONDITIONS: None    REVIEW OF SYSTEMS: Full 14 system review of systems performed and negative with exception of: noted in the HPI   ALLERGIES: Allergies  Allergen Reactions   Penicillin G Anaphylaxis   Aspirin Other (See Comments)   Codeine     Other reaction(s): Unknown   Prednisone Other (See Comments)   Sulfa Antibiotics Hives    HOME MEDICATIONS: Outpatient Medications Prior to Visit   Medication Sig Dispense Refill   acetaminophen (TYLENOL) 325 MG tablet Take 650 mg by mouth every 6 (six) hours as needed.     gabapentin (NEURONTIN) 100 MG capsule Take 1 capsule (100 mg total) by mouth in the morning AND 2 capsules (200 mg total) at bedtime. Take one capsule in am and two capsules in pm.. 270 capsule 3   methocarbamol (ROBAXIN) 500 MG tablet Take 1.5 tablets (750 mg total) by mouth 2 (two) times daily. (Patient taking differently: Take 750 mg by mouth 2 (two) times daily. Takes 1.5 tablets in the morning and 1 at night.) 270 tablet 3   No facility-administered medications prior to visit.    PAST MEDICAL HISTORY: Past Medical History:  Diagnosis Date   Arthritis     PAST SURGICAL HISTORY: Past Surgical History:  Procedure Laterality Date   right breast biopsy  2002    FAMILY HISTORY: Family History  Problem Relation Age of Onset   Lung cancer Mother    Cervical cancer Mother    Throat cancer Father    Prostate cancer Father     SOCIAL HISTORY: Social History   Socioeconomic History   Marital status: Divorced    Spouse name: Not on file   Number of children: Not on file   Years of education: Not on file   Highest education level: Not on file  Occupational History   Not on file  Tobacco Use   Smoking status: Former    Years: 5    Types: Cigarettes    Quit date: 1978    Years since quitting: 46.2   Smokeless tobacco: Never  Substance and Sexual Activity   Alcohol use: Never   Drug use: Never   Sexual activity: Not on file  Other Topics Concern   Not on file  Social History Narrative   Not on file   Social Determinants of Health   Financial Resource Strain: Not on file  Food Insecurity: Not on file  Transportation Needs: Not on file  Physical Activity: Not on file  Stress: Not on file  Social Connections: Not on file  Intimate Partner Violence: Not on file    PHYSICAL EXAM  GENERAL EXAM/CONSTITUTIONAL: Vitals:  Vitals:   12/25/22  1545  BP: 133/66  Pulse: 73  Weight: 177 lb (80.3 kg)  Height:  (1.6 m)   Body mass index is 31.35 kg/m. Wt Readings from Last 3 Encounters:  12/25/22 177 lb (80.3 kg)  07/09/22 169 lb 1 oz (76.7 kg)  04/08/22 151 lb (68.5 kg)   Patient is in no distress; well developed, nourished and groomed; neck is supple  EYES: Visual fields full to confrontation, Extraocular movements intacts,   MUSCULOSKELETAL: Gait, strength, tone, movements noted in Neurologic exam below  NEUROLOGIC: MENTAL STATUS:      No data to display  awake, alert, oriented to person, place and time recent and remote memory intact normal attention and concentration language fluent, comprehension intact, naming intact fund of knowledge appropriate  CRANIAL NERVE:  2nd, 3rd, 4th, 6th - visual fields full to confrontation, extraocular muscles intact, no nystagmus 5th - facial sensation symmetric 7th - facial strength symmetric 8th - hearing intact 9th - palate elevates symmetrically, uvula midline 11th - shoulder shrug symmetric 12th - tongue protrusion midline  MOTOR:  normal bulk and tone, full strength in the BUE, BLE. There is decrease strength about 4/5 for right foot dorsiflexion and inversion when compared to the left.   SENSORY:  Decrease sensation to vibration and pinprick up to ankle bilateral lower extremities   COORDINATION:  finger-nose-finger, fine finger movements normal  REFLEXES:  deep tendon reflexes present and symmetric  GAIT/STATION:  Wide based, high steppage gait. walk on her the tip of right foot. Gait appears to be somewhat magnetic. Uses a walker    DIAGNOSTIC DATA (LABS, IMAGING, TESTING) - I reviewed patient records, labs, notes, testing and imaging myself where available.  Lab Results  Component Value Date   WBC 12.1 (H) 02/13/2022   HGB 13.9 02/13/2022   HCT 42.5 02/13/2022   MCV 89 02/13/2022   PLT 295 02/13/2022      Component Value Date/Time    NA 140 02/13/2022 1542   K 4.1 02/13/2022 1542   CL 100 02/13/2022 1542   CO2 24 02/13/2022 1542   GLUCOSE 94 02/13/2022 1542   GLUCOSE 123 (H) 01/20/2022 1926   BUN 8 02/13/2022 1542   CREATININE 0.56 (L) 02/13/2022 1542   CALCIUM 9.6 02/13/2022 1542   PROT 7.1 02/13/2022 1542   ALBUMIN 5.2 (H) 02/13/2022 1542   AST 18 02/13/2022 1542   ALT 20 02/13/2022 1542   ALKPHOS 88 02/13/2022 1542   BILITOT <0.2 02/13/2022 1542   GFRNONAA >60 01/20/2022 1823   No results found for: "CHOL", "HDL", "LDLCALC", "LDLDIRECT", "TRIG", "CHOLHDL" Lab Results  Component Value Date   HGBA1C 5.9 (H) 02/13/2022   Lab Results  Component Value Date   VITAMINB12 1,305 (H) 02/13/2022   Lab Results  Component Value Date   TSH 2.240 02/13/2022   Head CT 01/20/22 No acute intracranial findings are seen in the noncontrast CT brain. Atrophy.  MRI Lumbar spine 05/21/21 1.  Mild multilevel lumbar spondylosis with mild to moderate right-sided foraminal stenosis at L3-L4 and L4-L5. 2.  Mild bilateral subarticular recess stenosis at L2-L3, L3 on 4 and L4-5.  No high-grade canal stenosis.   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   ASSESSMENT AND PLAN  62 y.o. year old female with no reported past medical history who is presenting for follow up for right lower radicular pain, right foot drop and right foot numbness.  Reports that symptoms are the same, gabapentin and Robaxin helped. Currently she is using a walker, had falls when not using the walker. Continue to work with social services to obtain insurance. Once you have insurance, please contact us, will refer back to therapy. I have also give her a prescription for an ankle brace, she will try to pay out of pocket.   1. 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     Orders Placed This Encounter  Procedures   Ankle  brace    No orders of the defined types were placed in this encounter.    Return in about 1 year (around 12/25/2023).     Windell Norfolk, MD 12/27/2022, 1:57 PM  [facility] [address], [address] [place], [place] [address] [phone]

## 2022-12-27 NOTE — Patient Instructions (Signed)
Continue using a walker  Ankle foot brace  Return as needed.

## 2023-01-08 ENCOUNTER — Ambulatory Visit: Payer: Medicaid Other | Admitting: Neurology

## 2023-01-17 DIAGNOSIS — M5441 Lumbago with sciatica, right side: Secondary | ICD-10-CM | POA: Diagnosis not present

## 2023-01-17 DIAGNOSIS — R110 Nausea: Secondary | ICD-10-CM | POA: Diagnosis not present

## 2023-01-17 DIAGNOSIS — M5442 Lumbago with sciatica, left side: Secondary | ICD-10-CM | POA: Diagnosis not present

## 2023-01-17 DIAGNOSIS — R269 Unspecified abnormalities of gait and mobility: Secondary | ICD-10-CM | POA: Diagnosis not present

## 2023-01-17 DIAGNOSIS — Z862 Personal history of diseases of the blood and blood-forming organs and certain disorders involving the immune mechanism: Secondary | ICD-10-CM | POA: Diagnosis not present

## 2023-02-06 DIAGNOSIS — M5412 Radiculopathy, cervical region: Secondary | ICD-10-CM | POA: Diagnosis not present

## 2023-02-06 DIAGNOSIS — E7800 Pure hypercholesterolemia, unspecified: Secondary | ICD-10-CM | POA: Diagnosis not present

## 2023-02-06 DIAGNOSIS — R7303 Prediabetes: Secondary | ICD-10-CM | POA: Diagnosis not present

## 2023-02-06 DIAGNOSIS — R202 Paresthesia of skin: Secondary | ICD-10-CM | POA: Diagnosis not present

## 2023-02-06 DIAGNOSIS — M21371 Foot drop, right foot: Secondary | ICD-10-CM | POA: Diagnosis not present

## 2023-02-06 DIAGNOSIS — Z862 Personal history of diseases of the blood and blood-forming organs and certain disorders involving the immune mechanism: Secondary | ICD-10-CM | POA: Diagnosis not present

## 2023-02-06 DIAGNOSIS — S8411XD Injury of peroneal nerve at lower leg level, right leg, subsequent encounter: Secondary | ICD-10-CM | POA: Diagnosis not present

## 2023-02-06 DIAGNOSIS — M5441 Lumbago with sciatica, right side: Secondary | ICD-10-CM | POA: Diagnosis not present

## 2023-02-06 DIAGNOSIS — R269 Unspecified abnormalities of gait and mobility: Secondary | ICD-10-CM | POA: Diagnosis not present

## 2023-02-06 DIAGNOSIS — M5442 Lumbago with sciatica, left side: Secondary | ICD-10-CM | POA: Diagnosis not present

## 2023-02-06 DIAGNOSIS — G8929 Other chronic pain: Secondary | ICD-10-CM | POA: Diagnosis not present

## 2023-02-07 ENCOUNTER — Telehealth: Payer: Self-pay | Admitting: Neurology

## 2023-02-07 NOTE — Telephone Encounter (Signed)
Pt stated she went to PCP and they want Dr. Teresa Coombs to order a MRI on top part of her back.

## 2023-02-10 NOTE — Telephone Encounter (Signed)
Call to patient to advise that Dr. Teresa Coombs would need to see her in the office before ordering MRI. Advised his next appointment wasn't until september, she stated she couldn't wait that long. Advised I would see if she could see NP and follow back up with her.

## 2023-02-10 NOTE — Telephone Encounter (Signed)
Called [facility] at at [facility] and they stated no mri was discussed and they do not know where she got that idea from.

## 2023-02-10 NOTE — Telephone Encounter (Signed)
I will have to see her in clinic and examine her prior to ordering the MRI. Please have her schedule a follow up. Next available. Thanks

## 2023-02-10 NOTE — Telephone Encounter (Signed)
She can see any NP who has availabilities.

## 2023-02-10 NOTE — Telephone Encounter (Signed)
Call to patient to follow up on report of patient stating PCP wanted Dr. Teresa Coombs to order an MRI for back/neck pain. Patient states she saw her PCP [place] and she requested an MRI due to neck and back pain/soreness at top of her spine and felling pressure causing her headaches. She also reports throbbing in her right ear. Haleigh, CMA, called [facility] and they states there is no documentation of that recommendation. Inquired about why PCP didn't order MRI and she stated because PCP said that she had a neurologist. Advised that I send to Dr. Teresa Coombs for recommendations and follow back up. Patient appreciative of call.

## 2023-02-25 ENCOUNTER — Telehealth: Payer: Self-pay | Admitting: Neurology

## 2023-02-25 NOTE — Telephone Encounter (Signed)
Call to Dr. Conchita Paris office to request records, automated recording. Sent to medical records to request last visit information.

## 2023-02-25 NOTE — Telephone Encounter (Signed)
Thank you :)

## 2023-02-25 NOTE — Telephone Encounter (Signed)
Call from [place], [place] at PCP office, she states that [place] saw her on 02/06/23 and gave her Gabapentin 200 mg sig 2 tablets 2x day for 30 days for numbness and pain in neck, hands. She also made a referral to neurosurgery to evaluate for cervical/lumbar radiculopathy. Gerarda Gunther states that [place] refilled her Gabapentin today. She did state that she advised patient to follow up with neurology at next appointment but patient is confused even though daughter was present at visit. Advised I would let Dr. Teresa Coombs know of conversation. Appreciative of call.

## 2023-02-25 NOTE — Telephone Encounter (Signed)
Call to patient, she states her PCP wants Dr. Teresa Coombs to increase her Gabapentin due to pain, but also states she is having leg surgery on June 18th by Dr. Lisbeth Renshaw but she doesn't understand why because she is having headaches.She does state she was started on Meloxicam 15mg  1 tab daily for 30 days.  She states that she never got an MRI or any xrays. Advised Dr. Teresa Coombs is out of office this morning but I will discuss with him and follow back up.   Call to PCP [facility] office, left message with Cassie to return call for more detailed information on patient and patient visit.

## 2023-02-25 NOTE — Telephone Encounter (Signed)
Pt stated her PCP wants Dr. Teresa Coombs to up her gabapentin (NEURONTIN) 100 MG capsule. Stated she is still having a lot of pressure in her back.

## 2023-03-31 NOTE — Progress Notes (Unsigned)
[facility]  PATIENT: Shawna Suarez DOB: Jul 20, 1961  REQUESTING CLINICIAN: Hyacinth Meeker, [place], [place] HISTORY FROM: Patient  REASON FOR VISIT: Right leg pain and right foot drop    HISTORICAL  CHIEF COMPLAINT:  No chief complaint on file.   Interval history 03/31/2023 JM: Since prior visit, she was seen by neurosurgery and underwent right peroneal nerve release at the fibular head in June who noted some improvement of dorsiflexion weakness on the right after procedure.  Per neurosurgery note, no further recommendations in regards to chronic back pain and scheduled to see pain management doctor on 02/21/2023.  Current use of gabapentin and Robaxin    History provided from Dr. Karie Georges prior OV note for reference purposes only INTERVAL HISTORY 12/25/2022 Shawna Suarez presents today for follow-up, last visit was in October.  Since then she reports her situation is the same.  She was unfortunately unable to get full coverage insurance, therefore could not go to physical therapy and could not get her ankle brace.  She does use a walker with ambulation but still has fall.  Denies any worsening of the pain or weakness but with right foot drop, she still has she still have gait instability.  She was told that she will be approved for full Medicare 2025..   INTERVAL HISTORY 07/09/2022 Shawna Suarez presented for follow-up, she continues to have issues with ambulation, uses walker.  Reported she continue to do exercise at home and there is mild improvement of the right foot.  She has upcoming disability evaluation at the end of the month.  Lately for her she was able to get Medicare. Continue to report falls but no injuries.   INTERVAL HISTORY 04/08/22: Patient presents today for follow-up, last visit plan was to restart physical therapy, she reports that she defer PT because it will cost her more than $4000 out to pocket therefore has not completed.  She denies any fall since last visit.  She  is doing the physical therapy work exercise at home and uses a cane.  She still trying to work with her employer to obtain disability. She reports that Gabapentin and Robaxin has helped and needs referral.    INTERVAL HISTORY 02/12/22:  Patient presents today for follow-up, she is accompanied by her daughter's mother-in-law Shawna Suarez.  She reported that she completed physical therapy but it made her back pain worse, now she is having spasm in the back all the way to the neck.  She said her gait is worse due to her right foot drop now the left leg is involved now.  She does use a walker but had fallen 5 times within the past month.  Currently she is on leave at work because she cannot perform her duties due to gait instability.  Right lower extremity still weak.   She reports 1 of the doctor at [facility] has recommended a brace but physical therapist advised against it.  At that time she was still getting physical therapy so I deferred the decision to use of brace to physical therapy.  She still not using a brace in the right lower leg. She is still complaining of bilateral foot numbness which is making her gait worse.    HISTORY OF PRESENT ILLNESS:  This is a 62 year old woman with no reported past medical history who is presenting with complaint of right lower extremity pain, described as shooting pain, and right foot weakness.  Patient said the symptoms started since last September after receiving the second dose  of COVID.  Since that she has been having intermittent pain.  She followed with orthopedist, had a lumbar MRI which showed no abnormality other than degenerative disc disease.  She is able to ambulate but stated she has to be very careful, denies any falls.  She reported the pain is located right at the hip joint and radiates behind right leg all the way down to the foot, there is also report of numbness on the bottom of the foot.  Denies back pain.    OTHER MEDICAL CONDITIONS: None     REVIEW OF SYSTEMS: Full 14 system review of systems performed and negative with exception of: noted in the HPI   ALLERGIES: Allergies  Allergen Reactions   Penicillin G Anaphylaxis   Aspirin Other (See Comments)   Codeine     Other reaction(s): Unknown   Prednisone Other (See Comments)   Sulfa Antibiotics Hives    HOME MEDICATIONS: Outpatient Medications Prior to Visit  Medication Sig Dispense Refill   acetaminophen (TYLENOL) 325 MG tablet Take 650 mg by mouth every 6 (six) hours as needed.     gabapentin (NEURONTIN) 100 MG capsule Take 1 capsule (100 mg total) by mouth in the morning AND 2 capsules (200 mg total) at bedtime. Take one capsule in am and two capsules in pm.. 270 capsule 3   methocarbamol (ROBAXIN) 500 MG tablet Take 1.5 tablets (750 mg total) by mouth 2 (two) times daily. (Patient taking differently: Take 750 mg by mouth 2 (two) times daily. Takes 1.5 tablets in the morning and 1 at night.) 270 tablet 3   No facility-administered medications prior to visit.    PAST MEDICAL HISTORY: Past Medical History:  Diagnosis Date   Arthritis     PAST SURGICAL HISTORY: Past Surgical History:  Procedure Laterality Date   right breast biopsy  2002    FAMILY HISTORY: Family History  Problem Relation Age of Onset   Lung cancer Mother    Cervical cancer Mother    Throat cancer Father    Prostate cancer Father     SOCIAL HISTORY: Social History   Socioeconomic History   Marital status: Divorced    Spouse name: Not on file   Number of children: Not on file   Years of education: Not on file   Highest education level: Not on file  Occupational History   Not on file  Tobacco Use   Smoking status: Former    Years: 5    Types: Cigarettes    Quit date: 1978    Years since quitting: 46.5   Smokeless tobacco: Never  Substance and Sexual Activity   Alcohol use: Never   Drug use: Never   Sexual activity: Not on file  Other Topics Concern   Not on file   Social History Narrative   Not on file   Social Determinants of Health   Financial Resource Strain: Not on file  Food Insecurity: Not on file  Transportation Needs: Not on file  Physical Activity: Not on file  Stress: Not on file  Social Connections: Not on file  Intimate Partner Violence: Not on file    PHYSICAL EXAM  GENERAL EXAM/CONSTITUTIONAL: Vitals:  There were no vitals filed for this visit.  There is no height or weight on file to calculate BMI. Wt Readings from Last 3 Encounters:  12/25/22 177 lb (80.3 kg)  07/09/22 169 lb 1 oz (76.7 kg)  04/08/22 151 lb (68.5 kg)   Patient is in no distress;  well developed, nourished and groomed; neck is supple  EYES: Visual fields full to confrontation, Extraocular movements intacts,   MUSCULOSKELETAL: Gait, strength, tone, movements noted in Neurologic exam below  NEUROLOGIC: MENTAL STATUS:      No data to display         awake, alert, oriented to person, place and time recent and remote memory intact normal attention and concentration language fluent, comprehension intact, naming intact fund of knowledge appropriate  CRANIAL NERVE:  2nd, 3rd, 4th, 6th - visual fields full to confrontation, extraocular muscles intact, no nystagmus 5th - facial sensation symmetric 7th - facial strength symmetric 8th - hearing intact 9th - palate elevates symmetrically, uvula midline 11th - shoulder shrug symmetric 12th - tongue protrusion midline  MOTOR:  normal bulk and tone, full strength in the BUE, BLE. There is decrease strength about 4/5 for right foot dorsiflexion and inversion when compared to the left.   SENSORY:  Decrease sensation to vibration and pinprick up to ankle bilateral lower extremities   COORDINATION:  finger-nose-finger, fine finger movements normal  REFLEXES:  deep tendon reflexes present and symmetric  GAIT/STATION:  Wide based, high steppage gait. walk on her the tip of right foot. Gait  appears to be somewhat magnetic. Uses a walker    DIAGNOSTIC DATA (LABS, IMAGING, TESTING) - I reviewed patient records, labs, notes, testing and imaging myself where available.  Lab Results  Component Value Date   WBC 12.1 (H) 02/13/2022   HGB 13.9 02/13/2022   HCT 42.5 02/13/2022   MCV 89 02/13/2022   PLT 295 02/13/2022      Component Value Date/Time   NA 140 02/13/2022 1542   K 4.1 02/13/2022 1542   CL 100 02/13/2022 1542   CO2 24 02/13/2022 1542   GLUCOSE 94 02/13/2022 1542   GLUCOSE 123 (H) 01/20/2022 1926   BUN 8 02/13/2022 1542   CREATININE 0.56 (L) 02/13/2022 1542   CALCIUM 9.6 02/13/2022 1542   PROT 7.1 02/13/2022 1542   ALBUMIN 5.2 (H) 02/13/2022 1542   AST 18 02/13/2022 1542   ALT 20 02/13/2022 1542   ALKPHOS 88 02/13/2022 1542   BILITOT <0.2 02/13/2022 1542   GFRNONAA >60 01/20/2022 1823   No results found for: "CHOL", "HDL", "LDLCALC", "LDLDIRECT", "TRIG", "CHOLHDL" Lab Results  Component Value Date   HGBA1C 5.9 (H) 02/13/2022   Lab Results  Component Value Date   VITAMINB12 1,305 (H) 02/13/2022   Lab Results  Component Value Date   TSH 2.240 02/13/2022   Head CT 01/20/22 No acute intracranial findings are seen in the noncontrast CT brain. Atrophy.  MRI Lumbar spine 05/21/21 1.  Mild multilevel lumbar spondylosis with mild to moderate right-sided foraminal stenosis at L3-L4 and L4-L5. 2.  Mild bilateral subarticular recess stenosis at L2-L3, L3 on 4 and L4-5.  No high-grade canal stenosis.   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   ASSESSMENT AND PLAN  62 y.o. year old female with no reported past medical history who is presenting for follow up for right lower radicular pain, right foot drop and right foot numbness.  Reports that symptoms are the same, gabapentin and Robaxin helped. Currently she is using a walker, had falls when not using the walker. Continue to work  with social services to obtain insurance. Once you have insurance, please contact us, will refer back to therapy. I have also give her a prescription for an ankle brace, she will try to pay  out of pocket.   No diagnosis found.    There are no Patient Instructions on file for this visit.   No orders of the defined types were placed in this encounter.   No orders of the defined types were placed in this encounter.    No follow-ups on file.     I spent *** minutes of face-to-face and non-face-to-face time with patient.  This included previsit chart review, lab review, study review, order entry, electronic health record documentation, patient education and discussion regarding above diagnoses and treatment plan and answered all other questions to patients satisfaction  Ihor Austin, [facility]  [facility] [address] [place], [place] [address]  Phone [phone] Fax [phone] Note: This document was prepared with digital dictation and possible smart phrase technology. Any transcriptional errors that result from this process are unintentional.

## 2023-04-01 ENCOUNTER — Ambulatory Visit: Payer: Medicaid Other | Admitting: Adult Health

## 2023-04-01 ENCOUNTER — Encounter: Payer: Self-pay | Admitting: Adult Health

## 2023-04-01 VITALS — BP 117/75 | HR 79 | Ht 63.0 in | Wt 182.4 lb

## 2023-04-01 DIAGNOSIS — M5412 Radiculopathy, cervical region: Secondary | ICD-10-CM | POA: Diagnosis not present

## 2023-04-01 DIAGNOSIS — G5731 Lesion of lateral popliteal nerve, right lower limb: Secondary | ICD-10-CM | POA: Diagnosis not present

## 2023-04-01 MED ORDER — GABAPENTIN 100 MG PO CAPS: 300.0000 mg | ORAL_CAPSULE | Freq: Two times a day (BID) | ORAL | 11 refills | Status: DC

## 2023-04-01 NOTE — Patient Instructions (Addendum)
You will be contacted to schedule cervical imaging   Increase gabapentin gradually to 300mg  twice daily - if you still have pain after a couple weeks at this done, please call for dosage increase      Followup in the future with me in 3 months or call earlier if needed       Thank you for coming to see [place] at [facility]. I hope we have been able to provide you high quality care today.  You may receive a patient satisfaction survey over the next few weeks. We would appreciate your feedback and comments so that we may continue to improve ourselves and the health of our patients.

## 2023-04-05 ENCOUNTER — Other Ambulatory Visit: Payer: Self-pay | Admitting: Neurology

## 2023-04-05 DIAGNOSIS — M5412 Radiculopathy, cervical region: Secondary | ICD-10-CM

## 2023-04-07 NOTE — Progress Notes (Signed)
Thank you :)

## 2023-04-08 ENCOUNTER — Telehealth: Payer: Self-pay | Admitting: Neurology

## 2023-04-08 NOTE — Telephone Encounter (Signed)
[employer] [place]: [phone] exp. 04/08/23-06/06/23 sent to GI [phone]

## 2023-04-25 ENCOUNTER — Ambulatory Visit
Admission: RE | Admit: 2023-04-25 | Discharge: 2023-04-25 | Disposition: A | Discharge status: Home / Self Care | Payer: Medicaid Other | Source: Ambulatory Visit | Attending: Neurology | Admitting: Neurology

## 2023-04-25 DIAGNOSIS — M5412 Radiculopathy, cervical region: Secondary | ICD-10-CM

## 2023-05-01 ENCOUNTER — Other Ambulatory Visit: Payer: Self-pay | Admitting: Neurology

## 2023-05-02 NOTE — Addendum Note (Signed)
Addended byWindell Norfolk on: 05/02/2023 02:16 PM   Modules accepted: Orders

## 2023-05-05 ENCOUNTER — Telehealth: Payer: Self-pay | Admitting: Adult Health

## 2023-05-05 NOTE — Telephone Encounter (Signed)
I left a voicemail (per DPR) for the patient to inform her that gabapentin 100 mg increased to 300 mg daily was sent out during her last office visit. She was advised via voicemail to call the pharmacy in order to have refills processed.

## 2023-05-05 NOTE — Telephone Encounter (Signed)
Pt said, only have 5 tablet left of gabapentin (NEURONTIN) 100 MG capsule. Dosage was increased to 300mg  daily last office visit. Refill was refused for requesting refill too soon. Would like a call back.

## 2023-05-06 MED ORDER — GABAPENTIN 300 MG PO CAPS: 300.0000 mg | ORAL_CAPSULE | Freq: Two times a day (BID) | ORAL | 1 refills | Status: DC

## 2023-05-06 NOTE — Telephone Encounter (Signed)
Pt has called back to relay that she has spoken with the pharmacy, they are telling her they never received the script from 7-09, pt asking if it can be resent

## 2023-05-06 NOTE — Addendum Note (Signed)
Addended by: Judi Cong on: 05/06/2023 09:33 AM   Modules accepted: Orders

## 2023-05-06 NOTE — Telephone Encounter (Signed)
7/9 dose was increased to Gabapentin 300 mg BID. Will send the updated order for the dose change.

## 2023-05-30 ENCOUNTER — Other Ambulatory Visit: Payer: Self-pay | Admitting: Neurology

## 2023-07-22 ENCOUNTER — Other Ambulatory Visit: Payer: Self-pay | Admitting: Orthopedic Surgery

## 2023-08-06 NOTE — Patient Instructions (Signed)
SURGICAL WAITING ROOM VISITATION  Patients having surgery or a procedure may have no more than 2 support people in the waiting area - these visitors may rotate.    Children under the age of 44 must have an adult with them who is not the patient.  Due to an increase in RSV and influenza rates and associated hospitalizations, children ages 15 and under may not visit patients in [facility] hospitals.  If the patient needs to stay at the hospital during part of their recovery, the visitor guidelines for inpatient rooms apply. Pre-op nurse will coordinate an appropriate time for 1 support person to accompany patient in pre-op.  This support person may not rotate.    Please refer to the [facility] website for the visitor guidelines for Inpatients (after your surgery is over and you are in a regular room).       Your procedure is scheduled on: 08/18/23   Report to [facility] Main Entrance    Report to admitting at  8:15 AM   Call this number if you have problems the morning of surgery [phone]   Do not eat food :After Midnight.   After Midnight you may have the following liquids until 7:45 AM DAY OF SURGERY  Water Non-Citrus Juices (without pulp, NO RED-Apple, White grape, White cranberry) Black Coffee (NO MILK/CREAM OR CREAMERS, sugar ok)  Clear Tea (NO MILK/CREAM OR CREAMERS, sugar ok) regular and decaf                             Plain Jell-O (NO RED)                                           Fruit ices (not with fruit pulp, NO RED)                                     Popsicles (NO RED)                                                               Sports drinks like Gatorade (NO RED)                  The day of surgery:  Drink ONE (1) Pre-Surgery Clear Ensure at 7:45  AM the morning of surgery. Drink in one sitting. Do not sip.  This drink was given to you during your hospital  pre-op appointment visit. Nothing else to drink after completing the  Pre-Surgery  Clear Ensure .       Oral Hygiene is also important to reduce your risk of infection.                                    Remember - BRUSH YOUR TEETH THE MORNING OF SURGERY WITH YOUR REGULAR TOOTHPASTE   Stop all vitamins and herbal supplements 7 days before surgery.   Take these medicines the morning of surgery with A SIP OF WATER: Tylenol, Atorvastatin, Gabapentin  You may not have any metal on your body including hair pins, jewelry, and body piercing             Do not wear make-up, lotions, powders, perfumes/cologne, or deodorant  Do not wear nail polish including gel and S&S, artificial/acrylic nails, or any other type of covering on natural nails including finger and toenails. If you have artificial nails, gel coating, etc. that needs to be removed by a nail salon please have this removed prior to surgery or surgery may need to be canceled/ delayed if the surgeon/ anesthesia feels like they are unable to be safely monitored.   Do not shave  48 hours prior to surgery.    Do not bring valuables to the hospital. [facility] IS NOT             RESPONSIBLE   FOR VALUABLES.   Contacts, glasses, dentures or bridgework may not be worn into surgery. .   DO NOT BRING YOUR HOME MEDICATIONS TO THE HOSPITAL. PHARMACY WILL DISPENSE MEDICATIONS LISTED ON YOUR MEDICATION LIST TO YOU DURING YOUR ADMISSION IN THE HOSPITAL!    Patients discharged on the day of surgery will not be allowed to drive home.  Someone NEEDS to stay with you for the first 24 hours after anesthesia.   Special Instructions: Bring a copy of your healthcare power of attorney and living will documents the day of surgery if you haven't scanned them before.              Please read over the following fact sheets you were given: IF YOU HAVE QUESTIONS ABOUT YOUR PRE-OP INSTRUCTIONS PLEASE CALL [phone] Shawna Suarez   If you received a COVID test during your pre-op visit  it is requested that you wear a mask when out in  public, stay away from anyone that may not be feeling well and notify your surgeon if you develop symptoms. If you test positive for Covid or have been in contact with anyone that has tested positive in the last 10 days please notify you surgeon.      Pre-operative 5 CHG Suarez Instructions   You can play a key role in reducing the risk of infection after surgery. Your skin needs to be as free of germs as possible. You can reduce the number of germs on your skin by washing with CHG (chlorhexidine gluconate) soap before surgery. CHG is an antiseptic soap that kills germs and continues to kill germs even after washing.   DO NOT use if you have an allergy to chlorhexidine/CHG or antibacterial soaps. If your skin becomes reddened or irritated, stop using the CHG and notify one of our RNs at [phone].   Please shower with the CHG soap starting 4 days before surgery using the following schedule:     Please keep in mind the following:  DO NOT shave, including legs and underarms, starting the day of your first shower.   You may shave your face at any point before/day of surgery.  Place clean sheets on your bed the day you start using CHG soap. Use a clean washcloth (not used since being washed) for each shower. DO NOT sleep with pets once you start using the CHG.   CHG Shower Instructions:  If you choose to wash your hair and private area, wash first with your normal shampoo/soap.  After you use shampoo/soap, rinse your hair and body thoroughly to remove shampoo/soap residue.  Turn the water OFF and apply about 3 tablespoons (45  ml) of CHG soap to a CLEAN washcloth.  Apply CHG soap ONLY FROM YOUR NECK DOWN TO YOUR TOES (washing for 3-5 minutes)  DO NOT use CHG soap on face, private areas, open wounds, or sores.  Pay special attention to the area where your surgery is being performed.  If you are having back surgery, having someone wash your back for you may be helpful. Wait 2 minutes after CHG  soap is applied, then you may rinse off the CHG soap.  Pat dry with a clean towel  Put on clean clothes/pajamas   If you choose to wear lotion, please use ONLY the CHG-compatible lotions on the back of this paper.     Additional instructions for the day of surgery: DO NOT APPLY any lotions, deodorants, cologne, or perfumes.   Put on clean/comfortable clothes.  Brush your teeth.  Ask your nurse before applying any prescription medications to the skin.      CHG Compatible Lotions   Aveeno Moisturizing lotion  Cetaphil Moisturizing Cream  Cetaphil Moisturizing Lotion  Clairol Herbal Essence Moisturizing Lotion, Dry Skin  Clairol Herbal Essence Moisturizing Lotion, Extra Dry Skin  Clairol Herbal Essence Moisturizing Lotion, Normal Skin  Curel Age Defying Therapeutic Moisturizing Lotion with Alpha Hydroxy  Curel Extreme Care Body Lotion  Curel Soothing Hands Moisturizing Hand Lotion  Curel Therapeutic Moisturizing Cream, Fragrance-Free  Curel Therapeutic Moisturizing Lotion, Fragrance-Free  Curel Therapeutic Moisturizing Lotion, Original Formula  Eucerin Daily Replenishing Lotion  Eucerin Dry Skin Therapy Plus Alpha Hydroxy Crme  Eucerin Dry Skin Therapy Plus Alpha Hydroxy Lotion  Eucerin Original Crme  Eucerin Original Lotion  Eucerin Plus Crme Eucerin Plus Lotion  Eucerin TriLipid Replenishing Lotion  Keri Anti-Bacterial Hand Lotion  Keri Deep Conditioning Original Lotion Dry Skin Formula Softly Scented  Keri Deep Conditioning Original Lotion, Fragrance Free Sensitive Skin Formula  Keri Lotion Fast Absorbing Fragrance Free Sensitive Skin Formula  Keri Lotion Fast Absorbing Softly Scented Dry Skin Formula  Keri Original Lotion  Keri Skin Renewal Lotion Keri Silky Smooth Lotion  Keri Silky Smooth Sensitive Skin Lotion  Nivea Body Creamy Conditioning Oil  Nivea Body Extra Enriched Lotion  Nivea Body Original Lotion  Nivea Body Sheer Moisturizing Lotion Nivea Crme  Nivea  Skin Firming Lotion  NutraDerm 30 Skin Lotion  NutraDerm Skin Lotion  NutraDerm Therapeutic Skin Cream  NutraDerm Therapeutic Skin Lotion  ProShield Protective Hand Cream   Incentive Spirometer (Watch this video at home: [email or web address])  An incentive spirometer is a tool that can help keep your lungs clear and active. This tool measures how well you are filling your lungs with each breath. Taking long deep breaths may help reverse or decrease the chance of developing breathing (pulmonary) problems (especially infection) following: A long period of time when you are unable to move or be active. BEFORE THE PROCEDURE  If the spirometer includes an indicator to show your best effort, your nurse or respiratory therapist will set it to a desired goal. If possible, sit up straight or lean slightly forward. Try not to slouch. Hold the incentive spirometer in an upright position. INSTRUCTIONS FOR USE  Sit on the edge of your bed if possible, or sit up as far as you can in bed or on a chair. Hold the incentive spirometer in an upright position. Breathe out normally. Place the mouthpiece in your mouth and seal your lips tightly around it. Breathe in slowly and as deeply as possible, raising the piston or the ball toward the top  of the column. Hold your breath for 3-5 seconds or for as long as possible. Allow the piston or ball to fall to the bottom of the column. Remove the mouthpiece from your mouth and breathe out normally. Rest for a few seconds and repeat Steps 1 through 7 at least 10 times every 1-2 hours when you are awake. Take your time and take a few normal breaths between deep breaths. The spirometer may include an indicator to show your best effort. Use the indicator as a goal to work toward during each repetition. After each set of 10 deep breaths, practice coughing to be sure your lungs are clear. If you have an incision (the cut made at the time of surgery),  support your incision when coughing by placing a pillow or rolled up towels firmly against it. Once you are able to get out of bed, walk around indoors and cough well. You may stop using the incentive spirometer when instructed by your caregiver.  RISKS AND COMPLICATIONS Take your time so you do not get dizzy or light-headed. If you are in pain, you may need to take or ask for pain medication before doing incentive spirometry. It is harder to take a deep breath if you are having pain. AFTER USE Rest and breathe slowly and easily. It can be helpful to keep track of a log of your progress. Your caregiver can provide you with a simple table to help with this. If you are using the spirometer at home, follow these instructions: SEEK MEDICAL CARE IF:  You are having difficultly using the spirometer. You have trouble using the spirometer as often as instructed. Your pain medication is not giving enough relief while using the spirometer. You develop fever of 100.5 F (38.1 C) or higher. SEEK IMMEDIATE MEDICAL CARE IF:  You cough up bloody sputum that had not been present before. You develop fever of 102 F (38.9 C) or greater. You develop worsening pain at or near the incision site. MAKE SURE YOU:  Understand these instructions. Will watch your condition. Will get help right away if you are not doing well or get worse. Document Released: 01/20/2007 Document Revised: 12/02/2011 Document Reviewed: 03/23/2007 [facility] Patient Information 2014 [place], [place].

## 2023-08-06 NOTE — Progress Notes (Signed)
COVID Vaccine received:  []  No [x]  Yes Date of any COVID positive Test in last 90 days: no PCP - Evangeline Dakin PA Cardiologist - no  Chest x-ray -  EKG -   Stress Test -  ECHO -  Cardiac Cath -   Bowel Prep - [x]  No  []   Yes ______  Pacemaker / ICD device [x]  No []  Yes   Spinal Cord Stimulator:[x]  No []  Yes       History of Sleep Apnea? [x]  No []  Yes   CPAP used?- [x]  No []  Yes    Does the patient monitor blood sugar?          [x]  No []  Yes  []  N/A  Patient has: [x]  NO Hx DM   []  Pre-DM                 []  DM1  []   DM2 Does patient have a [employer] or [employer]? []  No []  Yes   Fasting Blood Sugar Ranges-  Checks Blood Sugar _____ times a day  GLP1 agonist / usual dose - no GLP1 instructions:  SGLT-2 inhibitors / usual dose - no SGLT-2 instructions:   Blood Thinner / Instructions:no Aspirin Instructions:no  Comments:   Activity level: Patient is able  to climb a flight of stairs without difficulty; [x]  No CP  [x]  No SOB,  ___   Patient can perform ADLs without assistance.   Anesthesia review: Heart murmur  Patient denies shortness of breath, fever, cough and chest pain at PAT appointment.  Patient verbalized understanding and agreement to the Pre-Surgical Instructions that were given to them at this PAT appointment. Patient was also educated of the need to review these PAT instructions again prior to his/her surgery.I reviewed the appropriate phone numbers to call if they have any and questions or concerns.

## 2023-08-07 ENCOUNTER — Encounter (HOSPITAL_COMMUNITY): Payer: Self-pay

## 2023-08-07 ENCOUNTER — Other Ambulatory Visit: Payer: Self-pay

## 2023-08-07 ENCOUNTER — Encounter (HOSPITAL_COMMUNITY)
Admission: RE | Admit: 2023-08-07 | Discharge: 2023-08-07 | Disposition: A | Discharge status: Home / Self Care | Payer: Medicaid Other | Source: Ambulatory Visit | Attending: Orthopedic Surgery | Admitting: Orthopedic Surgery

## 2023-08-07 VITALS — BP 135/70 | HR 66 | Temp 98.1°F | Resp 16 | Ht 63.0 in | Wt 170.0 lb

## 2023-08-07 DIAGNOSIS — Z01812 Encounter for preprocedural laboratory examination: Secondary | ICD-10-CM | POA: Diagnosis not present

## 2023-08-07 DIAGNOSIS — Z01818 Encounter for other preprocedural examination: Secondary | ICD-10-CM | POA: Diagnosis present

## 2023-08-07 HISTORY — DX: Myoneural disorder, unspecified: G70.9

## 2023-08-07 HISTORY — DX: Cardiac murmur, unspecified: R01.1

## 2023-08-07 LAB — CBC
HCT: 41.5 % (ref 36.0–46.0)
Hemoglobin: 13.1 g/dL (ref 12.0–15.0)
MCH: 29.5 pg (ref 26.0–34.0)
MCHC: 31.6 g/dL (ref 30.0–36.0)
MCV: 93.5 fL (ref 80.0–100.0)
Platelets: 278 10*3/uL (ref 150–400)
RBC: 4.44 MIL/uL (ref 3.87–5.11)
RDW: 12 % (ref 11.5–15.5)
WBC: 6.8 10*3/uL (ref 4.0–10.5)
nRBC: 0 % (ref 0.0–0.2)

## 2023-08-07 LAB — SURGICAL PCR SCREEN
MRSA, PCR: NEGATIVE
Staphylococcus aureus: NEGATIVE

## 2023-08-13 DIAGNOSIS — M1712 Unilateral primary osteoarthritis, left knee: Secondary | ICD-10-CM | POA: Diagnosis present

## 2023-08-13 NOTE — H&P (Signed)
TOTAL KNEE ADMISSION H&P  Patient is being admitted for left total knee arthroplasty.  Subjective:  Chief Complaint:left knee pain.  HPI: Shawna Suarez, 62 y.o. female, has a history of pain and functional disability in the left knee due to arthritis and has failed non-surgical conservative treatments for greater than 12 weeks to includeNSAID's and/or analgesics, corticosteriod injections, flexibility and strengthening excercises, use of assistive devices, weight reduction as appropriate, and activity modification.  Onset of symptoms was gradual, starting 2 years ago with gradually worsening course since that time. The patient noted no past surgery on the left knee(s).  Patient currently rates pain in the left knee(s) at 10 out of 10 with activity. Patient has night pain, worsening of pain with activity and weight bearing, pain that interferes with activities of daily living, pain with passive range of motion, crepitus, and joint swelling.  Patient has evidence of joint space narrowing by imaging studies.  There is no active infection.  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   Current Outpatient Medications  Medication Sig Dispense Refill Last Dose   acetaminophen (TYLENOL) 650 MG CR tablet Take 650-1,300 mg by mouth every 8 (eight) hours as needed for pain.      atorvastatin (LIPITOR) 10 MG tablet Take 10 mg by mouth 3 (three) times a week. Tuesday, Thursdays & Saturdays.      B Complex-C (B-COMPLEX WITH VITAMIN C) tablet Take 1 tablet by mouth in the  morning.      Cholecalciferol (VITAMIN D3) 50 MCG (2000 UT) TABS Take 2,000 Units by mouth in the morning.      fluticasone (FLONASE) 50 MCG/ACT nasal spray Place 1 spray into both nostrils at bedtime as needed for allergies (nasal congestion.).      gabapentin (NEURONTIN) 300 MG capsule Take 1 capsule (300 mg total) by mouth 2 (two) times daily. 180 capsule 1    meloxicam (MOBIC) 15 MG tablet Take 15 mg by mouth daily.      methocarbamol (ROBAXIN) 500 MG tablet TAKE 1.5 TABLETS (750 MG TOTAL) BY MOUTH 2 (TWO) TIMES DAILY. 270 tablet 3    Multiple Vitamin (MULTIVITAMIN WITH MINERALS) TABS tablet Take 1 tablet by mouth in the morning.      ondansetron (ZOFRAN) 8 MG tablet Take 8 mg by mouth daily as needed for nausea or vomiting.      Allergies  Allergen Reactions   Penicillin G Anaphylaxis   Aspirin Other (See Comments)    syncope   Codeine Other (See Comments)    unknown   Prednisone Other (See Comments)    headaches   Sulfa Antibiotics Hives    Social History   Tobacco Use   Smoking status: Former    Current packs/day: 0.00    Types: Cigarettes    Start date: 1    Quit date: 1978    Years since quitting: 46.9   Smokeless tobacco: Never  Substance Use Topics   Alcohol use: Never    Family History  Problem Relation Age of Onset  Lung cancer Mother    Cervical cancer Mother    Throat cancer Father    Prostate cancer Father      Review of Systems  Constitutional: Negative.   Eyes: Negative.   Respiratory: Negative.    Cardiovascular:        Murmur  Gastrointestinal: Negative.   Endocrine: Negative.   Genitourinary: Negative.   Musculoskeletal:  Positive for arthralgias.  Allergic/Immunologic: Negative.   Neurological:  Positive for weakness.  Hematological: Negative.   Psychiatric/Behavioral: Negative.      Objective:  Physical Exam Constitutional:      Appearance: Normal appearance. She is normal weight.  HENT:     Head: Normocephalic and atraumatic.      Nose: Nose normal.  Eyes:     Pupils: Pupils are equal, round, and reactive to light.  Cardiovascular:     Pulses: Normal pulses.  Pulmonary:     Effort: Pulmonary effort is normal.  Musculoskeletal:        General: Tenderness and deformity present.     Cervical back: Normal range of motion and neck supple.     Comments: the patient's right knee has tenderness over the medial joint line and also medial femoral condyle region.  No instability.  No erythema or warmth.  Calves are soft and nontender.  Skin:    General: Skin is warm and dry.  Neurological:     General: No focal deficit present.     Mental Status: She is alert and oriented to person, place, and time. Mental status is at baseline.  Psychiatric:        Mood and Affect: Mood normal.        Behavior: Behavior normal.        Thought Content: Thought content normal.        Judgment: Judgment normal.     Vital signs in last 24 hours:    Labs:   Estimated body mass index is 30.11 kg/m as calculated from the following:   Height as of 08/07/23: 5\' 3"  (1.6 m).   Weight as of 08/07/23: 77.1 kg.   Imaging Review  Patient's MRI is reviewed and shows severe edema in the weightbearing surface of the medial femoral condyle suggestive of a nondisplaced nondepressed insufficiency fracture.  Patient does have full-thickness cartilage loss to weightbearing surface of the medial femoral condyle with subchondral reactive marrow edema.      Assessment/Plan:  End stage arthritis, left knee   The patient history, physical examination, clinical judgment of the provider and imaging studies are consistent with end stage degenerative joint disease of the left knee(s) and total knee arthroplasty is deemed medically necessary. The treatment options including medical management, injection therapy arthroscopy and arthroplasty were discussed at length. The risks and benefits of total knee arthroplasty were presented and reviewed. The  risks due to aseptic loosening, infection, stiffness, patella tracking problems, thromboembolic complications and other imponderables were discussed. The patient acknowledged the explanation, agreed to proceed with the plan and consent was signed. Patient is being admitted for inpatient treatment for surgery, pain control, PT, OT, prophylactic antibiotics, VTE prophylaxis, progressive ambulation and ADL's and discharge planning. The patient is planning to be discharged home with home health services     Patient's anticipated LOS is less than 2 midnights, meeting these requirements: - Younger than 52 - Lives within 1 hour of care - Has a competent adult at home to recover with post-op recover - NO history of  - Chronic pain requiring  opiods  - Diabetes  - Coronary Artery Disease  - Heart failure  - Heart attack  - Stroke  - DVT/VTE  - Cardiac arrhythmia  - Respiratory Failure/COPD  - Renal failure  - Anemia  - Advanced Liver disease

## 2023-08-17 DIAGNOSIS — Z862 Personal history of diseases of the blood and blood-forming organs and certain disorders involving the immune mechanism: Secondary | ICD-10-CM | POA: Insufficient documentation

## 2023-08-17 DIAGNOSIS — M5442 Lumbago with sciatica, left side: Secondary | ICD-10-CM | POA: Insufficient documentation

## 2023-08-17 DIAGNOSIS — E7800 Pure hypercholesterolemia, unspecified: Secondary | ICD-10-CM | POA: Insufficient documentation

## 2023-08-18 ENCOUNTER — Ambulatory Visit (HOSPITAL_COMMUNITY): Payer: Medicaid Other | Admitting: Physician Assistant

## 2023-08-18 ENCOUNTER — Ambulatory Visit (HOSPITAL_COMMUNITY)
Admission: RE | Admit: 2023-08-18 | Discharge: 2023-08-18 | Disposition: A | Discharge status: Home / Self Care | Payer: Medicaid Other | Source: Ambulatory Visit | Attending: Orthopedic Surgery | Admitting: Orthopedic Surgery

## 2023-08-18 ENCOUNTER — Encounter (HOSPITAL_COMMUNITY): Payer: Self-pay | Admitting: Orthopedic Surgery

## 2023-08-18 ENCOUNTER — Encounter (HOSPITAL_COMMUNITY)
Admission: RE | Disposition: A | Discharge status: Home / Self Care | Payer: Self-pay | Source: Ambulatory Visit | Attending: Orthopedic Surgery

## 2023-08-18 ENCOUNTER — Ambulatory Visit (HOSPITAL_COMMUNITY): Payer: Medicaid Other | Admitting: Certified Registered Nurse Anesthetist

## 2023-08-18 ENCOUNTER — Other Ambulatory Visit: Payer: Self-pay

## 2023-08-18 DIAGNOSIS — R2689 Other abnormalities of gait and mobility: Secondary | ICD-10-CM | POA: Insufficient documentation

## 2023-08-18 DIAGNOSIS — X58XXXA Exposure to other specified factors, initial encounter: Secondary | ICD-10-CM | POA: Diagnosis not present

## 2023-08-18 DIAGNOSIS — M1712 Unilateral primary osteoarthritis, left knee: Secondary | ICD-10-CM | POA: Diagnosis present

## 2023-08-18 DIAGNOSIS — Z87891 Personal history of nicotine dependence: Secondary | ICD-10-CM | POA: Insufficient documentation

## 2023-08-18 DIAGNOSIS — M8448XA Pathological fracture, other site, initial encounter for fracture: Secondary | ICD-10-CM | POA: Diagnosis not present

## 2023-08-18 DIAGNOSIS — M25762 Osteophyte, left knee: Secondary | ICD-10-CM | POA: Diagnosis not present

## 2023-08-18 HISTORY — PX: TOTAL KNEE ARTHROPLASTY: SHX125

## 2023-08-18 SURGERY — ARTHROPLASTY, KNEE, TOTAL
Anesthesia: Monitor Anesthesia Care | Site: Knee | Laterality: Left

## 2023-08-18 MED ORDER — BUPIVACAINE LIPOSOME 1.3 % IJ SUSP: 20.0000 mL | Freq: Once | INTRAMUSCULAR | Status: DC

## 2023-08-18 MED ORDER — AMISULPRIDE (ANTIEMETIC) 5 MG/2ML IV SOLN: 10.0000 mg | Freq: Once | INTRAVENOUS | Status: DC | PRN

## 2023-08-18 MED ORDER — BUPIVACAINE LIPOSOME 1.3 % IJ SUSP
INTRAMUSCULAR | Status: DC | PRN
  Administered 2023-08-18: 20 mL

## 2023-08-18 MED ORDER — DEXAMETHASONE SODIUM PHOSPHATE 10 MG/ML IJ SOLN
INTRAMUSCULAR | Status: DC | PRN
  Administered 2023-08-18: 5 mg via INTRAVENOUS

## 2023-08-18 MED ORDER — OXYCODONE HCL 5 MG PO TABS
5.0000 mg | ORAL_TABLET | ORAL | Status: DC | PRN
  Administered 2023-08-18: 10 mg via ORAL

## 2023-08-18 MED ORDER — SODIUM CHLORIDE (PF) 0.9 % IJ SOLN
INTRAMUSCULAR | Status: AC
  Filled 2023-08-18: qty 50

## 2023-08-18 MED ORDER — VITAMIN D3 50 MCG (2000 UT) PO TABS: 2000.0000 [IU] | ORAL_TABLET | Freq: Every morning | ORAL | Status: DC

## 2023-08-18 MED ORDER — ONDANSETRON HCL 4 MG/2ML IJ SOLN
INTRAMUSCULAR | Status: AC
  Filled 2023-08-18: qty 2

## 2023-08-18 MED ORDER — FENTANYL CITRATE PF 50 MCG/ML IJ SOSY: 25.0000 ug | PREFILLED_SYRINGE | INTRAMUSCULAR | Status: DC | PRN

## 2023-08-18 MED ORDER — METHOCARBAMOL 500 MG PO TABS
ORAL_TABLET | ORAL | Status: AC
  Filled 2023-08-18: qty 1

## 2023-08-18 MED ORDER — FENTANYL CITRATE PF 50 MCG/ML IJ SOSY
PREFILLED_SYRINGE | INTRAMUSCULAR | Status: AC
  Filled 2023-08-18: qty 2

## 2023-08-18 MED ORDER — DEXAMETHASONE SODIUM PHOSPHATE 10 MG/ML IJ SOLN
INTRAMUSCULAR | Status: AC
  Filled 2023-08-18: qty 1

## 2023-08-18 MED ORDER — OXYCODONE HCL 5 MG PO TABS: 5.0000 mg | ORAL_TABLET | ORAL | 0 refills | Status: AC | PRN

## 2023-08-18 MED ORDER — CHLORHEXIDINE GLUCONATE 0.12 % MT SOLN
15.0000 mL | Freq: Once | OROMUCOSAL | Status: AC
  Administered 2023-08-18: 15 mL via OROMUCOSAL

## 2023-08-18 MED ORDER — PROPOFOL 500 MG/50ML IV EMUL
INTRAVENOUS | Status: DC | PRN
  Administered 2023-08-18: 75 ug/kg/min via INTRAVENOUS

## 2023-08-18 MED ORDER — ONDANSETRON HCL 4 MG/2ML IJ SOLN
INTRAMUSCULAR | Status: DC | PRN
  Administered 2023-08-18: 4 mg via INTRAVENOUS

## 2023-08-18 MED ORDER — BUPIVACAINE-EPINEPHRINE 0.25% -1:200000 IJ SOLN
INTRAMUSCULAR | Status: AC
  Filled 2023-08-18: qty 1

## 2023-08-18 MED ORDER — CLONIDINE HCL (ANALGESIA) 100 MCG/ML EP SOLN
EPIDURAL | Status: DC | PRN
  Administered 2023-08-18: 50 ug

## 2023-08-18 MED ORDER — TRANEXAMIC ACID 1000 MG/10ML IV SOLN
2000.0000 mg | INTRAVENOUS | Status: DC
  Filled 2023-08-18: qty 20

## 2023-08-18 MED ORDER — FENTANYL CITRATE PF 50 MCG/ML IJ SOSY
50.0000 ug | PREFILLED_SYRINGE | Freq: Once | INTRAMUSCULAR | Status: AC
  Administered 2023-08-18: 50 ug via INTRAVENOUS

## 2023-08-18 MED ORDER — PROPOFOL 10 MG/ML IV BOLUS
INTRAVENOUS | Status: DC | PRN
  Administered 2023-08-18: 30 mg via INTRAVENOUS
  Administered 2023-08-18: 20 mg via INTRAVENOUS

## 2023-08-18 MED ORDER — WATER FOR IRRIGATION, STERILE IR SOLN
Status: DC | PRN
  Administered 2023-08-18: 1000 mL

## 2023-08-18 MED ORDER — POVIDONE-IODINE 10 % EX SWAB: 2.0000 | Freq: Once | CUTANEOUS | Status: DC

## 2023-08-18 MED ORDER — LACTATED RINGERS IV SOLN: INTRAVENOUS | Status: DC

## 2023-08-18 MED ORDER — METHOCARBAMOL 500 MG PO TABS
500.0000 mg | ORAL_TABLET | Freq: Four times a day (QID) | ORAL | Status: DC | PRN
  Administered 2023-08-18: 500 mg via ORAL

## 2023-08-18 MED ORDER — PROPOFOL 1000 MG/100ML IV EMUL
INTRAVENOUS | Status: AC
  Filled 2023-08-18: qty 100

## 2023-08-18 MED ORDER — SODIUM CHLORIDE (PF) 0.9 % IJ SOLN
INTRAMUSCULAR | Status: AC
  Filled 2023-08-18: qty 20

## 2023-08-18 MED ORDER — TRANEXAMIC ACID-NACL 1000-0.7 MG/100ML-% IV SOLN
1000.0000 mg | INTRAVENOUS | Status: AC
  Administered 2023-08-18: 1000 mg via INTRAVENOUS
  Filled 2023-08-18: qty 100

## 2023-08-18 MED ORDER — 0.9 % SODIUM CHLORIDE (POUR BTL) OPTIME
TOPICAL | Status: DC | PRN
  Administered 2023-08-18: 1000 mL

## 2023-08-18 MED ORDER — ORAL CARE MOUTH RINSE: 15.0000 mL | Freq: Once | OROMUCOSAL | Status: AC

## 2023-08-18 MED ORDER — TRANEXAMIC ACID-NACL 1000-0.7 MG/100ML-% IV SOLN: 1000.0000 mg | Freq: Once | INTRAVENOUS | Status: AC

## 2023-08-18 MED ORDER — TRANEXAMIC ACID-NACL 1000-0.7 MG/100ML-% IV SOLN
INTRAVENOUS | Status: AC
  Administered 2023-08-18: 1000 mg via INTRAVENOUS
  Filled 2023-08-18: qty 100

## 2023-08-18 MED ORDER — METHOCARBAMOL 1000 MG/10ML IJ SOLN: 500.0000 mg | Freq: Four times a day (QID) | INTRAMUSCULAR | Status: DC | PRN

## 2023-08-18 MED ORDER — VANCOMYCIN HCL IN DEXTROSE 1-5 GM/200ML-% IV SOLN
1000.0000 mg | Freq: Once | INTRAVENOUS | Status: AC
  Administered 2023-08-18: 1000 mg via INTRAVENOUS
  Filled 2023-08-18: qty 200

## 2023-08-18 MED ORDER — ROPIVACAINE HCL 5 MG/ML IJ SOLN
INTRAMUSCULAR | Status: DC | PRN
  Administered 2023-08-18: 20 mL via PERINEURAL

## 2023-08-18 MED ORDER — SODIUM CHLORIDE (PF) 0.9 % IJ SOLN
INTRAMUSCULAR | Status: DC | PRN
  Administered 2023-08-18: 70 mL

## 2023-08-18 MED ORDER — APIXABAN 2.5 MG PO TABS: 2.5000 mg | ORAL_TABLET | Freq: Two times a day (BID) | ORAL | 0 refills | Status: AC

## 2023-08-18 MED ORDER — LACTATED RINGERS IV BOLUS
500.0000 mL | Freq: Once | INTRAVENOUS | Status: AC
  Administered 2023-08-18: 500 mL via INTRAVENOUS

## 2023-08-18 MED ORDER — PHENYLEPHRINE HCL-NACL 20-0.9 MG/250ML-% IV SOLN
INTRAVENOUS | Status: DC | PRN
  Administered 2023-08-18: 40 ug/min via INTRAVENOUS

## 2023-08-18 MED ORDER — OXYCODONE HCL 5 MG PO TABS
ORAL_TABLET | ORAL | Status: AC
  Filled 2023-08-18: qty 2

## 2023-08-18 MED ORDER — LACTATED RINGERS IV BOLUS
250.0000 mL | Freq: Once | INTRAVENOUS | Status: AC
  Administered 2023-08-18: 250 mL via INTRAVENOUS

## 2023-08-18 MED ORDER — BUPIVACAINE LIPOSOME 1.3 % IJ SUSP
INTRAMUSCULAR | Status: AC
  Filled 2023-08-18: qty 20

## 2023-08-18 MED ORDER — BUPIVACAINE IN DEXTROSE 0.75-8.25 % IT SOLN
INTRATHECAL | Status: DC | PRN
  Administered 2023-08-18: 1.8 mL via INTRATHECAL

## 2023-08-18 MED ORDER — BUPIVACAINE-EPINEPHRINE (PF) 0.25% -1:200000 IJ SOLN
INTRAMUSCULAR | Status: DC | PRN
  Administered 2023-08-18: 30 mL

## 2023-08-18 SURGICAL SUPPLY — 38 items
ATTUNE MED DOME PAT 38 KNEE (Knees) IMPLANT
ATTUNE PS FEM LT SZ 6 CEM KNEE (Femur) IMPLANT
ATTUNE PSRP INSR SZ6 5 KNEE (Insert) IMPLANT
BAG COUNTER SPONGE SURGICOUNT (BAG) IMPLANT
BAG DECANTER FOR FLEXI CONT (MISCELLANEOUS) ×1 IMPLANT
BAG ZIPLOCK 12X15 (MISCELLANEOUS) ×1 IMPLANT
BASE TIBIA ATTUNE KNEE SYS SZ6 (Knees) IMPLANT
BLADE SAG 18X100X1.27 (BLADE) ×1 IMPLANT
BLADE SAW SGTL 11.0X1.19X90.0M (BLADE) ×1 IMPLANT
BNDG ELASTIC 6X10 VLCR STRL LF (GAUZE/BANDAGES/DRESSINGS) ×1 IMPLANT
BOWL SMART MIX CTS (DISPOSABLE) ×1 IMPLANT
CEMENT HV SMART SET (Cement) ×2 IMPLANT
COVER SURGICAL LIGHT HANDLE (MISCELLANEOUS) ×1 IMPLANT
DRAPE INCISE IOBAN 66X45 STRL (DRAPES) IMPLANT
DRAPE U-SHAPE 47X51 STRL (DRAPES) ×1 IMPLANT
DRSG AQUACEL AG ADV 3.5X10 (GAUZE/BANDAGES/DRESSINGS) ×1 IMPLANT
DURAPREP 26ML APPLICATOR (WOUND CARE) ×1 IMPLANT
ELECT REM PT RETURN 15FT ADLT (MISCELLANEOUS) ×1 IMPLANT
GLOVE BIO SURGEON STRL SZ7.5 (GLOVE) ×1 IMPLANT
GLOVE BIO SURGEON STRL SZ8.5 (GLOVE) ×1 IMPLANT
GLOVE BIOGEL PI IND STRL 8 (GLOVE) ×1 IMPLANT
GLOVE BIOGEL PI IND STRL 9 (GLOVE) ×1 IMPLANT
GOWN STRL REUS W/ TWL XL LVL3 (GOWN DISPOSABLE) ×2 IMPLANT
HOOD PEEL AWAY T7 (MISCELLANEOUS) ×3 IMPLANT
KIT TURNOVER KIT A (KITS) IMPLANT
NS IRRIG 1000ML POUR BTL (IV SOLUTION) ×1 IMPLANT
PACK TOTAL KNEE CUSTOM (KITS) ×1 IMPLANT
PIN STEINMAN FIXATION KNEE (PIN) IMPLANT
PROTECTOR NERVE ULNAR (MISCELLANEOUS) ×1 IMPLANT
SET HNDPC FAN SPRY TIP SCT (DISPOSABLE) ×1 IMPLANT
SUT VIC AB 1 CTX36XBRD ANBCTR (SUTURE) ×1 IMPLANT
SUT VICRYL+ 3-0 36IN CT-1 (SUTURE) ×3 IMPLANT
SYR CONTROL 10ML LL (SYRINGE) ×2 IMPLANT
TIBIA ATTUNE KNEE SYS BASE SZ6 (Knees) ×1 IMPLANT
TRAY CATH INTERMITTENT SS 16FR (CATHETERS) ×1 IMPLANT
TUBE SUCTION HIGH CAP CLEAR NV (SUCTIONS) ×1 IMPLANT
WATER STERILE IRR 1000ML POUR (IV SOLUTION) ×2 IMPLANT
WRAP KNEE MAXI GEL POST OP (GAUZE/BANDAGES/DRESSINGS) ×1 IMPLANT

## 2023-08-18 NOTE — Discharge Instructions (Signed)

## 2023-08-18 NOTE — Evaluation (Signed)
Physical Therapy Evaluation Patient Details Name: Shawna Suarez MRN: [id number] DOB: [birth year] Today's Date: 08/18/2023  History of Present Illness  62 yo female s/p L TKA on 08/18/23. PMH: arthritis, NM d/o, heart murmur, RLE neuropathy, R drop foot (per pt report)  Clinical Impression  Pt is s/p TKA resulting in the deficits listed below (see PT Problem List).  Reviewed mobility and safety as well as necessary assist as noted below.  Pt is CGA -> min assist for gait x 100' with RW and will need hands on assist initially at home for safety and fall prevention. Pt and sister report pt's dtr will be primary caregiver and is available 24/7.  Pt instructed in HEP, progression and not over doing activity as her OPPT is ~ 1wk out on 08/25/23. Pt is motivated to d/c home today with family assisting.  Pt will benefit from  PT next venue to increase their independence, strength and ROM-knee     If plan is discharge home, recommend the following: Assistance with cooking/housework;Assist for transportation;Help with stairs or ramp for entrance (OPPT 12/2)   Can travel by private vehicle        Equipment Recommendations None recommended by PT  Recommendations for Other Services       Functional Status Assessment       Precautions / Restrictions Precautions Precautions: Fall;Knee Restrictions Weight Bearing Restrictions: No Other Position/Activity Restrictions: WBAT      Mobility  Bed Mobility Overal bed mobility: Needs Assistance Bed Mobility: Supine to Sit, Sit to Supine     Supine to sit: Contact guard, Supervision Sit to supine: Supervision, Contact guard assist   General bed mobility comments: incr time, CGA-supervision for safety; verbal cues for technique, self assist    Transfers Overall transfer level: Needs assistance Equipment used: Rolling walker (2 wheels) Transfers: Sit to/from Stand Sit to Stand: Contact guard assist           General transfer comment:  cues for hand placement and  and LLE position, overall    Ambulation/Gait Ambulation/Gait assistance: Contact guard assist, Min assist Gait Distance (Feet): 100 Feet Assistive device: Rolling walker (2 wheels) Gait Pattern/deviations: Step-to pattern       General Gait Details: cues for sequence and RW position, staying within frame of RW especially with turns. pt with LOB x1 with light min assist to recover. pt sister present, assist level and safety  reviewed  Stairs            Wheelchair Mobility     Tilt Bed    Modified Rankin (Stroke Patients Only)       Balance Overall balance assessment: History of Falls, Needs assistance Sitting-balance support: No upper extremity supported, Feet supported Sitting balance-Leahy Scale: Fair     Standing balance support: During functional activity, Reliant on assistive device for balance Standing balance-Leahy Scale: Poor Standing balance comment: reliant on UEs, CGA for safety with LOB x1 during dynamic tasks                             Pertinent Vitals/Pain Pain Assessment Pain Assessment: 0-10 Pain Score: 7  Pain Location: left knee Pain Descriptors / Indicators: Aching Pain Intervention(s): Limited activity within patient's tolerance, Monitored during session, Premedicated before session, Repositioned    Home Living Family/patient expects to be discharged to:: Private residence Living Arrangements: Alone Available Help at Discharge: Family (dtr staying) Type of Home: House Home Access: Stairs to enter  Entrance Stairs-Number of Steps: "almost flat"   Home Layout: One level Home Equipment: Agricultural consultant (2 wheels);Cane - single point;Cane - quad Additional Comments: wears AFO R LE    Prior Function Prior Level of Function : Independent/Modified Independent                     Extremity/Trunk Assessment   Upper Extremity Assessment Upper Extremity Assessment: Overall WFL for tasks  assessed    Lower Extremity Assessment Lower Extremity Assessment: LLE deficits/detail LLE Deficits / Details: ankle WFL, knee extension and hip flexion 3/5       Communication   Communication Communication: No apparent difficulties  Cognition Arousal: Alert Behavior During Therapy: WFL for tasks assessed/performed Overall Cognitive Status: Within Functional Limits for tasks assessed                                          General Comments      Exercises Total Joint Exercises Ankle Circles/Pumps: AROM, Both, 10 reps, Limitations Ankle Circles/Pumps Limitations: drop foot/ltd ROM R ankle Quad Sets: Both, 10 reps, AROM Heel Slides: AROM, AAROM, Left, 10 reps Hip ABduction/ADduction: AROM, Left, 5 reps Straight Leg Raises: AROM, Left, 10 reps   Assessment/Plan    PT Assessment All further PT needs can be met in the next venue of care  PT Problem List         PT Treatment Interventions      PT Goals (Current goals can be found in the Care Plan section)  Acute Rehab PT Goals PT Goal Formulation: All assessment and education complete, DC therapy    Frequency       Co-evaluation               AM-PAC PT "6 Clicks" Mobility  Outcome Measure Help needed turning from your back to your side while in a flat bed without using bedrails?: A Little Help needed moving from lying on your back to sitting on the side of a flat bed without using bedrails?: A Little Help needed moving to and from a bed to a chair (including a wheelchair)?: A Little Help needed standing up from a chair using your arms (e.g., wheelchair or bedside chair)?: A Little Help needed to walk in hospital room?: A Little Help needed climbing 3-5 steps with a railing? : A Little 6 Click Score: 18    End of Session Equipment Utilized During Treatment: Gait belt Activity Tolerance: Patient tolerated treatment well Patient left: with call bell/phone within reach;in bed;with  family/visitor present Nurse Communication: Mobility status PT Visit Diagnosis: Other abnormalities of gait and mobility (R26.89);Difficulty in walking, not elsewhere classified (R26.2)    Time: 8119-1478 PT Time Calculation (min) (ACUTE ONLY): 34 min   Charges:   PT Evaluation $PT Eval Low Complexity: 1 Low PT Treatments $Gait Training: 8-22 mins PT General Charges $$ ACUTE PT VISIT: 1 Visit         Yenny Kosa, PT  Acute Rehab Dept [facility]) [phone]  08/18/2023   [facility] 08/18/2023, 5:26 PM

## 2023-08-18 NOTE — OR Nursing (Signed)
IN AND OUT CATH AT END OF SURGERY= CLEAR URINE

## 2023-08-18 NOTE — Anesthesia Preprocedure Evaluation (Addendum)
Anesthesia Evaluation  Patient identified by MRN, date of birth, ID band Patient awake    Reviewed: Allergy & Precautions, NPO status , Patient's Chart, lab work & pertinent test results  Airway Mallampati: II  TM Distance: >3 FB Neck ROM: Full    Dental  (+) Dental Advisory Given   Pulmonary former smoker   breath sounds clear to auscultation       Cardiovascular negative cardio ROS  Rhythm:Regular Rate:Normal     Neuro/Psych  Neuromuscular disease    GI/Hepatic negative GI ROS, Neg liver ROS,,,  Endo/Other  negative endocrine ROS    Renal/GU negative Renal ROS     Musculoskeletal  (+) Arthritis ,    Abdominal   Peds  Hematology negative hematology ROS (+)   Anesthesia Other Findings   Reproductive/Obstetrics                             Anesthesia Physical Anesthesia Plan  ASA: 2  Anesthesia Plan: MAC and Spinal   Post-op Pain Management: Regional block* and Tylenol PO (pre-op)*   Induction: Intravenous  PONV Risk Score and Plan: 2 and Dexamethasone, Ondansetron, Propofol infusion and Treatment may vary due to age or medical condition  Airway Management Planned: Natural Airway and Simple Face Mask  Additional Equipment:   Intra-op Plan:   Post-operative Plan:   Informed Consent: I have reviewed the patients History and Physical, chart, labs and discussed the procedure including the risks, benefits and alternatives for the proposed anesthesia with the patient or authorized representative who has indicated his/her understanding and acceptance.       Plan Discussed with: CRNA  Anesthesia Plan Comments:        Anesthesia Quick Evaluation

## 2023-08-18 NOTE — Anesthesia Procedure Notes (Signed)
Spinal  Patient location during procedure: OR Start time: 08/18/2023 10:51 AM End time: 08/18/2023 10:56 AM Reason for block: surgical anesthesia Staffing Performed: anesthesiologist  Anesthesiologist: Marcene Duos, MD Performed by: Marcene Duos, MD Authorized by: Marcene Duos, MD   Preanesthetic Checklist Completed: patient identified, IV checked, site marked, risks and benefits discussed, surgical consent, monitors and equipment checked, pre-op evaluation and timeout performed Spinal Block Patient position: sitting Prep: DuraPrep Patient monitoring: heart rate, cardiac monitor, continuous pulse ox and blood pressure Approach: midline Location: L4-5 Injection technique: single-shot Needle Needle type: Pencan  Needle gauge: 24 G Needle length: 9 cm Assessment Sensory level: T4 Events: CSF return

## 2023-08-18 NOTE — Anesthesia Procedure Notes (Signed)
Anesthesia Regional Block: Adductor canal block   Pre-Anesthetic Checklist: , timeout performed,  Correct Patient, Correct Site, Correct Laterality,  Correct Procedure, Correct Position, site marked,  Risks and benefits discussed,  Surgical consent,  Pre-op evaluation,  At surgeon's request and post-op pain management  Laterality: Left  Prep: chloraprep       Needles:  Injection technique: Single-shot  Needle Type: Echogenic Needle     Needle Length: 9cm  Needle Gauge: 21     Additional Needles:   Procedures:,,,, ultrasound used (permanent image in chart),,    Narrative:  Start time: 08/18/2023 10:05 AM End time: 08/18/2023 10:10 AM Injection made incrementally with aspirations every 5 mL.  Performed by: Personally  Anesthesiologist: Marcene Duos, MD

## 2023-08-18 NOTE — Transfer of Care (Signed)
Immediate Anesthesia Transfer of Care Note  Patient: Shawna Suarez  Procedure(s) Performed: LEFT TOTAL KNEE ARTHROPLASTY (Left: Knee)  Patient Location: PACU  Anesthesia Type:Spinal  Level of Consciousness: awake and patient cooperative  Airway & Oxygen Therapy: Patient Spontanous Breathing and Patient connected to nasal cannula oxygen  Post-op Assessment: Report given to RN and Post -op Vital signs reviewed and stable  Post vital signs: Reviewed and stable  Last Vitals:  Vitals Value Taken Time  BP 118/58 08/18/23 1320  Temp    Pulse 79 08/18/23 1322  Resp 27 08/18/23 1322  SpO2 100 % 08/18/23 1322  Vitals shown include unfiled device data.  Last Pain:  Vitals:   08/18/23 1015  TempSrc:   PainSc: 0-No pain         Complications: No notable events documented.

## 2023-08-18 NOTE — Progress Notes (Signed)
Orthopedic Tech Progress Note Patient Details:  Shawna Suarez 1960/12/09 [id number]  Ortho Devices Type of Ortho Device: Bone foam zero knee Ortho Device/Splint Interventions: Ordered      [place] 08/18/2023, 4:40 PM

## 2023-08-18 NOTE — Anesthesia Postprocedure Evaluation (Signed)
Anesthesia Post Note  Patient: Shawna Suarez  Procedure(s) Performed: LEFT TOTAL KNEE ARTHROPLASTY (Left: Knee)     Patient location during evaluation: PACU Anesthesia Type: Spinal Level of consciousness: awake and alert Pain management: pain level controlled Vital Signs Assessment: post-procedure vital signs reviewed and stable Respiratory status: spontaneous breathing and respiratory function stable Cardiovascular status: blood pressure returned to baseline and stable Postop Assessment: spinal receding Anesthetic complications: no  No notable events documented.  Last Vitals:  Vitals:   08/18/23 1430 08/18/23 1445  BP: (!) 104/55 101/66  Pulse: (!) 58 70  Resp: 15 16  Temp:  36.6 C  SpO2: 93% 96%    Last Pain:  Vitals:   08/18/23 1445  TempSrc:   PainSc: 0-No pain    LLE Motor Response: Purposeful movement;Responds to commands (08/18/23 1445) LLE Sensation: Numbness (08/18/23 1445) RLE Motor Response: Purposeful movement;Responds to commands (08/18/23 1445) RLE Sensation: Numbness (08/18/23 1445) L Sensory Level: S1-Sole of foot, small toes (08/18/23 1445) R Sensory Level: S1-Sole of foot, small toes (08/18/23 1445)  Kennieth Rad

## 2023-08-18 NOTE — Anesthesia Procedure Notes (Signed)
Procedure Name: MAC Date/Time: 08/18/2023 11:06 AM  Performed by: Vanessa [place], CRNAPre-anesthesia Checklist: Patient identified, Emergency Drugs available, Suction available and Patient being monitored Patient Re-evaluated:Patient Re-evaluated prior to induction Oxygen Delivery Method: Simple face mask

## 2023-08-18 NOTE — Interval H&P Note (Signed)
History and Physical Interval Note:  08/18/2023 9:04 AM  Shawna Suarez  has presented today for surgery, with the diagnosis of left knee osteoarthritis and insuffiecncey fracture.  The various methods of treatment have been discussed with the patient and family. After consideration of risks, benefits and other options for treatment, the patient has consented to  Procedure(s): LEFT TOTAL KNEE ARTHROPLASTY (Left) as a surgical intervention.  The patient's history has been reviewed, patient examined, no change in status, stable for surgery.  I have reviewed the patient's chart and labs.  Questions were answered to the patient's satisfaction.     [name]

## 2023-08-18 NOTE — Op Note (Signed)
PATIENT ID:      Shawna Suarez  MRN:     [id number] DOB/AGE:    [birth year] / 63 y.o.       OPERATIVE REPORT   DATE OF PROCEDURE:  08/18/2023      PREOPERATIVE DIAGNOSIS:   left knee osteoarthritis and insuffiecncey fracture      Estimated body mass index is 30.11 kg/m as calculated from the following:   Height as of this encounter: 5\' 3"  (1.6 m).   Weight as of this encounter: 77.1 kg.                                                       POSTOPERATIVE DIAGNOSIS:   Same                                                                  PROCEDURE:  Procedure(s): LEFT TOTAL KNEE ARTHROPLASTY Using DepuyAttune RP implants #6L Femur, #6Tibia, 5 mm Attune RP bearing, 38 Patella    SURGEON: [name]  ASSISTANT:   Virgina Jock RNFA   (Present and scrubbed throughout the case, critical for assistance with exposure, retraction, instrumentation, and closure.)        ANESTHESIA: Spinal, 20cc Exparel, 50cc 0.25% Marcaine EBL: 300 cc FLUID REPLACEMENT: 1600 cc crystaloid TOURNIQUET: DRAINS: None TRANEXAMIC ACID: 1gm IV, 2gm topical COMPLICATIONS:  None         INDICATIONS FOR PROCEDURE: The patient has  left knee osteoarthritis and insuffiecncey fracture, Var deformities, XR shows bone on bone arthritis, lateral subluxation of tibia. Patient has failed all conservative measures including anti-inflammatory medicines, narcotics, attempts at exercise and weight loss, cortisone injections and viscosupplementation.  Risks and benefits of surgery have been discussed, questions answered.   DESCRIPTION OF PROCEDURE: The patient identified by armband, received  IV antibiotics, in the holding area at [facility]. Patient taken to the operating room, appropriate anesthetic monitors were attached, and Spina anesthesia was  induced. IV Tranexamic acid was given. Lateral post and 2 surefoot positioners applied to the table, the lower extremity was then prepped and draped in usual sterile fashion  from the toes to the high thigh. Time-out procedure was performed. Leyla Simpson RNFA, was present and scrubbed throughout the case, critical for assistance with, positioning, exposure, retraction, instrumentation, and closure.The skin and subcutaneous tissue along the incision was injected with 20 cc of a mixture of 20cc Exparel and 30cc Marcaine 50cc saline solution, using a 21-gauge by 1-1/2 inch needle. We began the operation, with the knee flexed 130 degrees, by making the anterior midline incision starting at handbreadth above the patella going over the patella 1 cm medial to and 4 cm distal to the tibial tubercle. Small bleeders in the skin and the subcutaneous tissue identified and cauterized. Transverse retinaculum was incised and reflected medially and a medial parapatellar arthrotomy was accomplished. the patella was everted and theprepatellar fat pad resected. The superficial medial collateral ligament was then elevated from anterior to posterior along the proximal flare of the tibia and anterior half of the menisci resected. The knee was hyperflexed exposing bone  on bone arthritis. Peripheral and notch osteophytes as well as the cruciate ligaments were then resected. We continued to work our way around posteriorly along the proximal tibia, and externally rotated the tibia subluxing it out from underneath the femur. A McHale PCL retractor was placed through the notch, a lateral Hohmann retractor, and anterolateral small homan retractor placed. We then entered the proximal tibia with the Depuy starter drill in line with the axis of the tibia followed by an intramedullary guide rod and 3-degree posterior slope cutting guide. The tibial cutting guide, was pinned into place allowing resection of 3 mm of bone medially and 10 mm of bone laterally. Satisfied with the tibial resection, we then entered the distal femur 2 mm anterior to the PCL origin with the starter drill, followed by the intramedullary guide  rod and applied the distal femoral cutting guide set at 9 mm, with 5 degrees of valgus. This was pinned along the epicondylar axis. At this point, the distal femoral cut was accomplished without difficulty. We then sized for a #6L femoral component and pinned the chamfer guide in 3 degrees of external rotation. The anterior, posterior, and chamfer cuts were accomplished without difficulty followed by the Attune RP box cutting guide and the box cut. We also removed posterior osteophytes from the posterior femoral condyles. The posterior capsule was injected with Exparel solution. The knee was brought into full extension. We checked our extension gap and fit a 5 mm trial lollipop. Distracting in extension with a lamina spreader,  bleeders in the posterior capsule, Posterior medial and posterior lateral gutter were cauterized.  The transexamic acid-soaked sponge was then placed in the gap of the knee in extension. The knee was flexed 30. The posterior patella cut was accomplished with the 9.5 mm Attune cutting guide, sized for a 38 mm dome, and the fixation pegs drilled.The knee was then once again hyperflexed exposing the proximal tibia. We sized for a # 6 tibial base plate, applied the smokestack and the conical reamer followed by the the Delta fin keel punch. We then hammered into place the Attune RP trial femoral component, drilled the lugs, inserted a  5 mm trial bearing, trial patellar button, and took the knee through range of motion from 0-130 degrees. Medial and lateral ligamentous stability was checked. No thumb pressure was required for patellar Tracking.  All trial components were removed, mating surfaces irrigated with pulse lavage, and dried with suction and sponges. 10 cc of the Exparel solution was applied to the cancellus bone of the patella distal femur and proximal tibia.  After waiting 30 seconds, the bony surfaces were again, dried with sponges. A double batch of DePuy HV cement was mixed and  applied to all bony metallic mating surfaces except for the posterior condyles of the femur itself. In order, we hammered into place the tibial tray and removed excess cement, the femoral component and removed excess cement. The final Attune RP bearing was inserted, and the knee brought to full extension with compression. The patellar button was clamped into place, and excess cement removed. The knee was held at 30 flexion with compression using the second surefoot, while the cement cured. The wound was irrigated out with normal saline solution pulse lavage. The rest of the Exparel was injected into the parapatellar arthrotomy, subcutaneous tissues, and periosteal tissues. The parapatellar arthrotomy was closed with running #1 Vicryl suture. The subcutaneous tissue with 3-0 undyed Vicryl suture, and the skin with running 3-0 SQ vicryl. An Aquacil  dressing and Ace wrap were applied. The patient was taken to recovery room without difficulty.   [name] 08/18/2023, 12:22 PM

## 2023-08-19 ENCOUNTER — Encounter (HOSPITAL_COMMUNITY): Payer: Self-pay | Admitting: Orthopedic Surgery

## 2023-08-25 ENCOUNTER — Ambulatory Visit (HOSPITAL_COMMUNITY)
Admission: RE | Admit: 2023-08-25 | Discharge: 2023-08-25 | Disposition: A | Discharge status: Home / Self Care | Payer: Medicaid Other | Source: Ambulatory Visit | Attending: Surgery | Admitting: Surgery

## 2023-08-25 ENCOUNTER — Other Ambulatory Visit (HOSPITAL_COMMUNITY): Payer: Self-pay | Admitting: Orthopedic Surgery

## 2023-08-25 ENCOUNTER — Ambulatory Visit: Payer: Medicaid Other | Admitting: Physical Therapy

## 2023-08-25 DIAGNOSIS — M7989 Other specified soft tissue disorders: Secondary | ICD-10-CM | POA: Insufficient documentation

## 2023-08-25 DIAGNOSIS — M79605 Pain in left leg: Secondary | ICD-10-CM | POA: Insufficient documentation

## 2023-08-25 NOTE — Therapy (Unsigned)
OUTPATIENT PHYSICAL THERAPY LOWER EXTREMITY EVALUATION   Patient Name: Shawna Suarez MRN: [id number] DOB:[birth year], 62 y.o., female Today's Date: 08/25/2023  END OF SESSION:   Past Medical History:  Diagnosis Date   Arthritis    Heart murmur    Neuromuscular disorder [facility])    Past Surgical History:  Procedure Laterality Date   right breast biopsy  2002   TOTAL KNEE ARTHROPLASTY Left 08/18/2023   Procedure: LEFT TOTAL KNEE ARTHROPLASTY;  Surgeon: Gean Birchwood, MD;  Location: [facility];  Service: Orthopedics;  Laterality: Left;   Patient Active Problem List   Diagnosis Date Noted   Elevated cholesterol 08/17/2023   History of anemia 08/17/2023   Lumbago with sciatica, left side 08/17/2023   Degenerative arthritis of left knee 08/13/2023   Encounter for screening for other metabolic disorders 12/25/2022   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  Rationale for Evaluation and Treatment: {HABREHAB:27488}  ONSET DATE: ***  SUBJECTIVE:   SUBJECTIVE STATEMENT: ***  PERTINENT HISTORY: *** PAIN:  Are you having pain? {OPRCPAIN:27236}  PRECAUTIONS: {Therapy precautions:24002}  RED FLAGS: {PT Red Flags:29287}   WEIGHT BEARING RESTRICTIONS: {Yes ***/No:24003}  FALLS:  Has patient fallen in last 6 months? {fallsyesno:27318}  LIVING ENVIRONMENT: Lives with: {[facility] lives with:25569::"lives with their family"} Lives in: {Lives in:25570} Stairs: {opstairs:27293} Has following equipment at home: {Assistive devices:23999}  OCCUPATION: ***  PLOF: {PLOF:24004}  PATIENT GOALS: ***  NEXT MD VISIT: ***  OBJECTIVE:  Note: Objective measures were completed at Evaluation unless otherwise noted.  DIAGNOSTIC FINDINGS: ***  PATIENT SURVEYS:  {rehab surveys:24030}  COGNITION: Overall cognitive status:  {cognition:24006}     SENSATION: {sensation:27233}  EDEMA:  {edema:24020}  MUSCLE LENGTH: Hamstrings: Right *** deg; Left *** deg Maisie Fus test: Right *** deg; Left *** deg  POSTURE: {posture:25561}  PALPATION: ***  LOWER EXTREMITY ROM:  {AROM/PROM:27142} ROM Right eval Left eval  Hip flexion    Hip extension    Hip abduction    Hip adduction    Hip internal rotation    Hip external rotation    Knee flexion    Knee extension    Ankle dorsiflexion    Ankle plantarflexion    Ankle inversion    Ankle eversion     (Blank rows = not tested)  LOWER EXTREMITY MMT:  MMT Right eval Left eval  Hip flexion    Hip extension    Hip abduction    Hip adduction    Hip internal rotation    Hip external rotation    Knee flexion    Knee extension    Ankle dorsiflexion    Ankle plantarflexion    Ankle inversion    Ankle eversion     (Blank rows = not tested)  LOWER EXTREMITY SPECIAL TESTS:  {LEspecialtests:26242}  FUNCTIONAL TESTS:  {Functional tests:24029}  GAIT: Distance walked: *** Assistive device utilized: {Assistive devices:23999} Level of assistance: {Levels of assistance:24026} Comments: ***   TODAY'S TREATMENT:  DATE: ***    PATIENT EDUCATION:  Education details: *** Person educated: {Person educated:25204} Education method: {Education Method:25205} Education comprehension: {Education Comprehension:25206}  HOME EXERCISE PROGRAM: ***  ASSESSMENT:  CLINICAL IMPRESSION: Patient is a *** y.o. *** who was seen today for physical therapy evaluation and treatment for ***.   OBJECTIVE IMPAIRMENTS: {opptimpairments:25111}.   ACTIVITY LIMITATIONS: {activitylimitations:27494}  PARTICIPATION LIMITATIONS: {participationrestrictions:25113}  PERSONAL FACTORS: {Personal factors:25162} are also affecting patient's functional outcome.    REHAB POTENTIAL: {rehabpotential:25112}  CLINICAL DECISION MAKING: {clinical decision making:25114}  EVALUATION COMPLEXITY: {Evaluation complexity:25115}   GOALS: Goals reviewed with patient? {yes/no:20286}  SHORT TERM GOALS: Target date: *** *** Baseline: Goal status: INITIAL  2.  *** Baseline:  Goal status: INITIAL  3.  *** Baseline:  Goal status: INITIAL  4.  *** Baseline:  Goal status: INITIAL  5.  *** Baseline:  Goal status: INITIAL  6.  *** Baseline:  Goal status: INITIAL  LONG TERM GOALS: Target date: ***  *** Baseline:  Goal status: INITIAL  2.  *** Baseline:  Goal status: INITIAL  3.  *** Baseline:  Goal status: INITIAL  4.  *** Baseline:  Goal status: INITIAL  5.  *** Baseline:  Goal status: INITIAL  6.  *** Baseline:  Goal status: INITIAL   PLAN:  PT FREQUENCY: {rehab frequency:25116}  PT DURATION: {rehab duration:25117}  PLANNED INTERVENTIONS: {rehab planned interventions:25118::"97110-Therapeutic exercises","97530- Therapeutic [phone]- Neuromuscular re-education","97535- Self JXBJ","47829- Manual therapy"}  PLAN FOR NEXT SESSION: ***   Haydan Wedig, PT 08/25/2023, 7:47 AM

## 2023-09-04 ENCOUNTER — Ambulatory Visit: Payer: Medicaid Other | Attending: Orthopedic Surgery

## 2023-09-04 ENCOUNTER — Other Ambulatory Visit: Payer: Self-pay

## 2023-09-04 DIAGNOSIS — G8929 Other chronic pain: Secondary | ICD-10-CM | POA: Insufficient documentation

## 2023-09-04 DIAGNOSIS — R600 Localized edema: Secondary | ICD-10-CM | POA: Diagnosis present

## 2023-09-04 DIAGNOSIS — R2689 Other abnormalities of gait and mobility: Secondary | ICD-10-CM | POA: Insufficient documentation

## 2023-09-04 DIAGNOSIS — M25562 Pain in left knee: Secondary | ICD-10-CM | POA: Diagnosis present

## 2023-09-04 DIAGNOSIS — M6281 Muscle weakness (generalized): Secondary | ICD-10-CM | POA: Insufficient documentation

## 2023-09-04 IMAGING — CT CT HEAD W/O CM
3 of 4 series · 15 of 47 positions shown, 18 images · non-contrast
Comparison: None.

CLINICAL DATA: Headaches, light headed feeling, vomiting



[Series 3: head 2.0 h70h · axial · 0.46mm/px · z∈[+1164,+1288]mm · 9 of 78 slices shown, 12 images]
[im 8/78  brain]
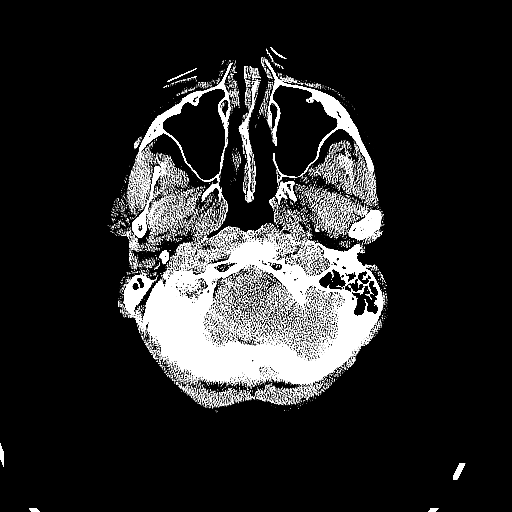
[im 8/78  bone]
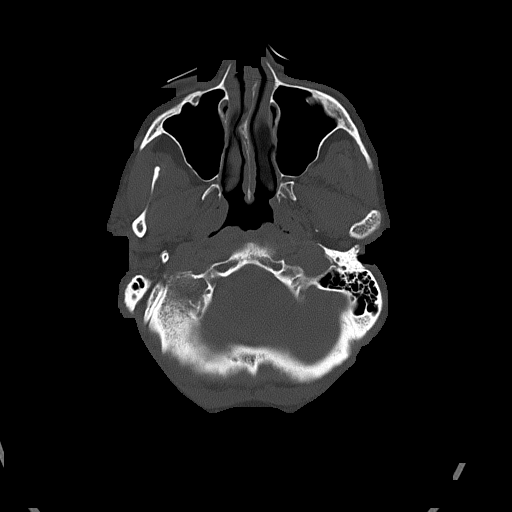
[im 16/78  brain]
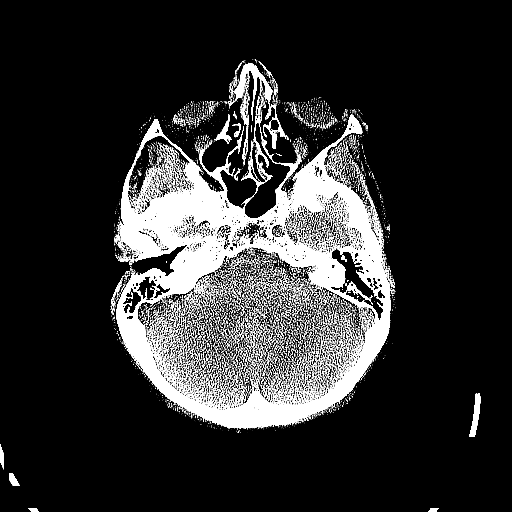
[im 24/78  brain]
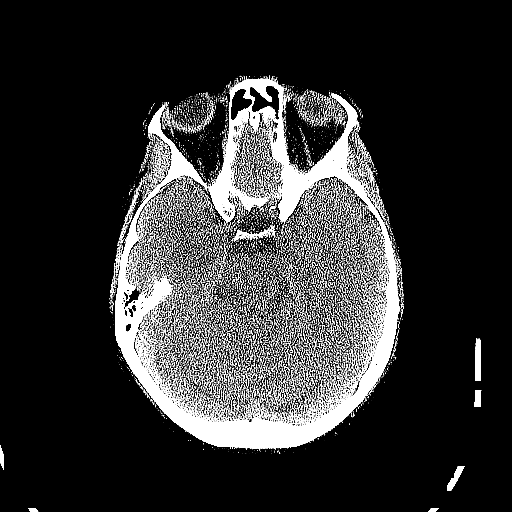
[im 31/78  brain]
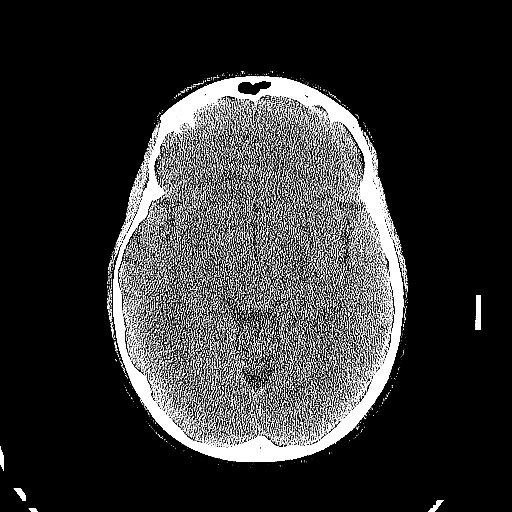
[im 39/78  brain]
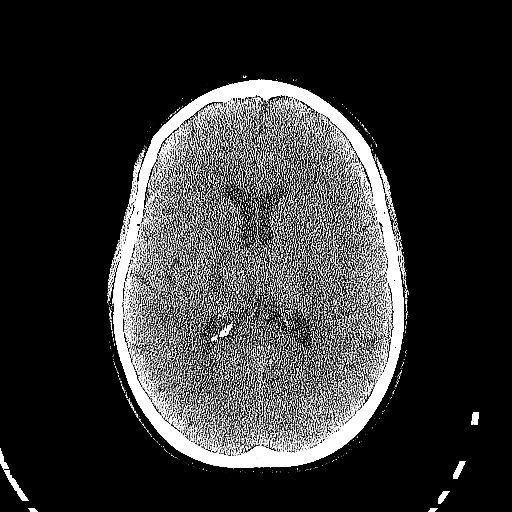
[im 39/78  bone]
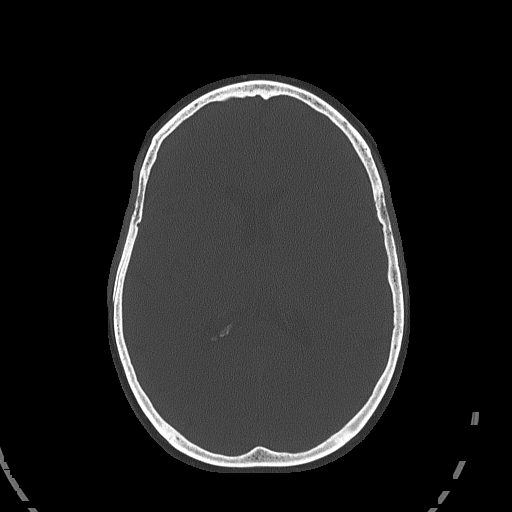
[im 47/78  brain]
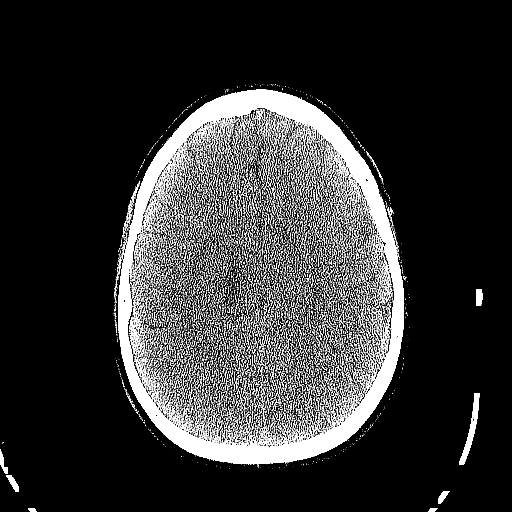
[im 54/78  brain]
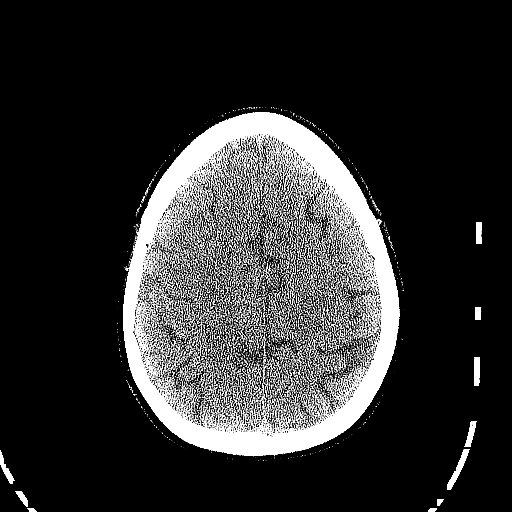
[im 62/78  brain]
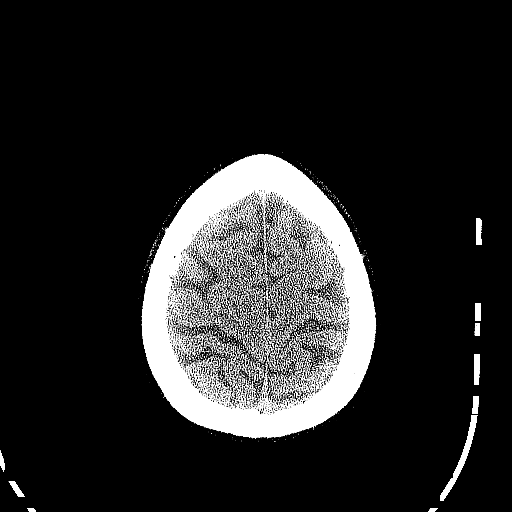
[im 70/78  brain]
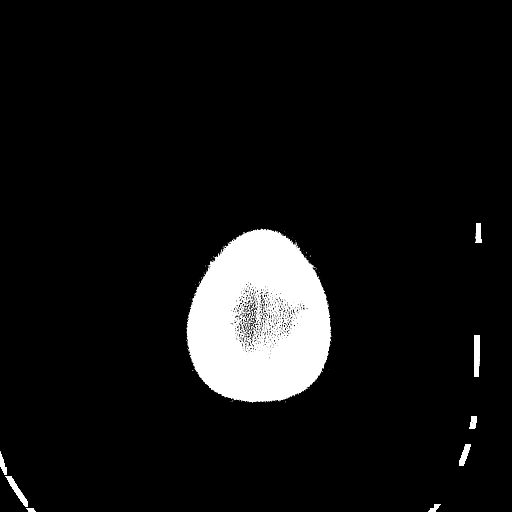
[im 70/78  bone]
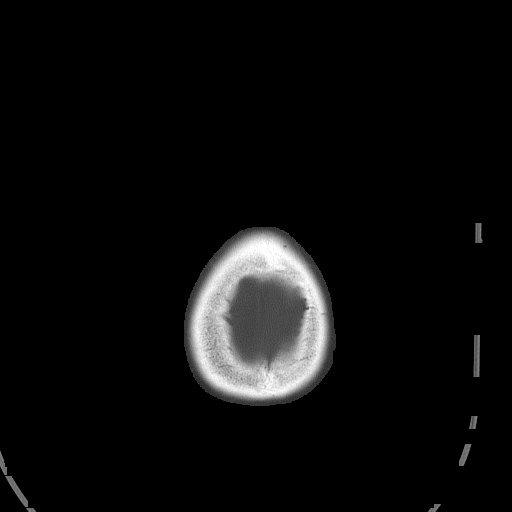

[Series 5: head 3.0 mpr cor · coronal · 0.31mm/px · 3 of 67 slices shown]
[im 23/67  brain]
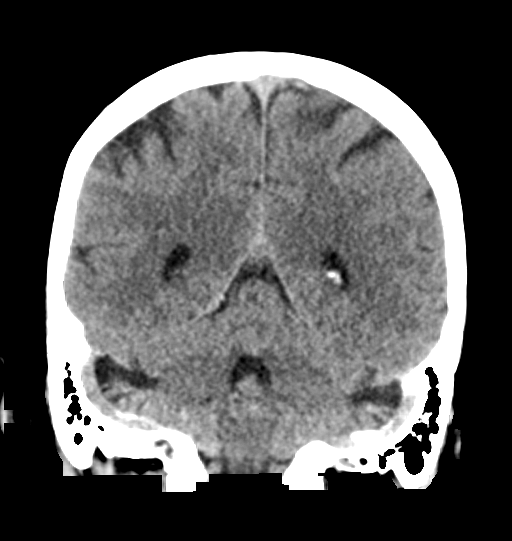
[im 30/67  brain]
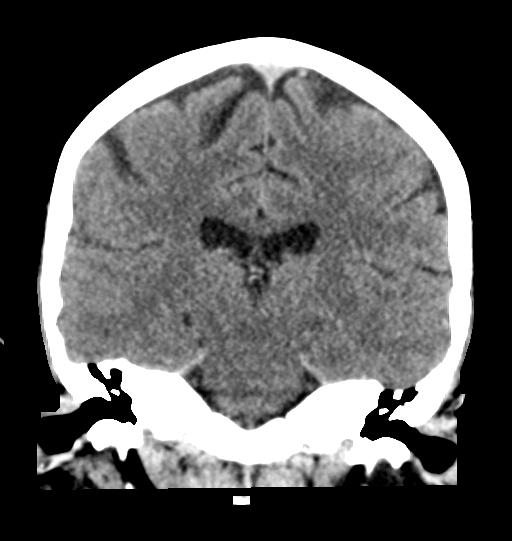
[im 37/67  brain]
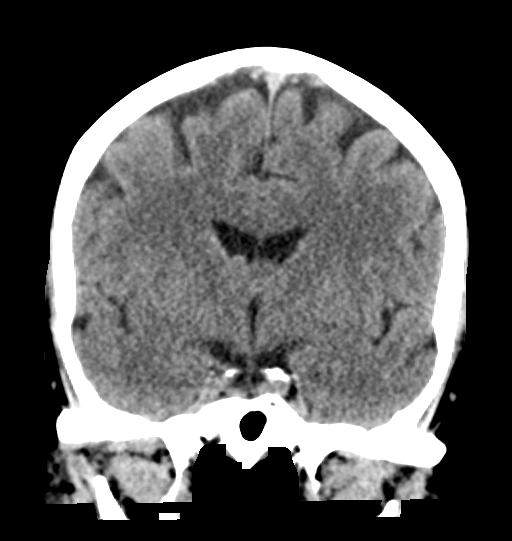

[Series 6: head 3.0 mpr sag · sagittal · 0.30mm/px · 3 of 53 slices shown]
[im 18/53  brain]
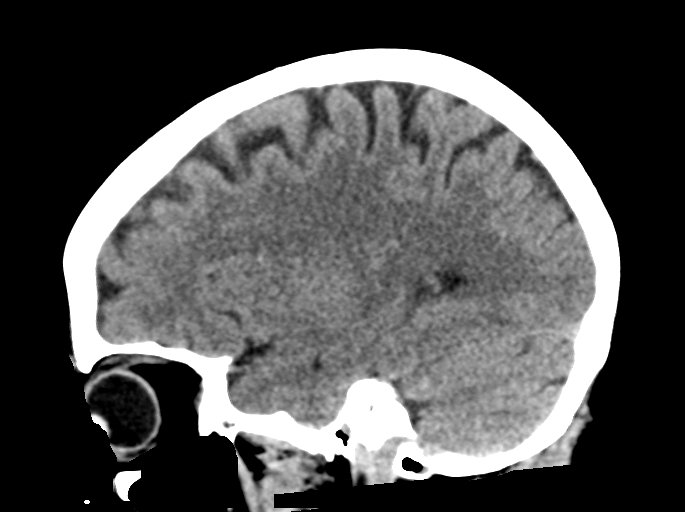
[im 27/53  brain]
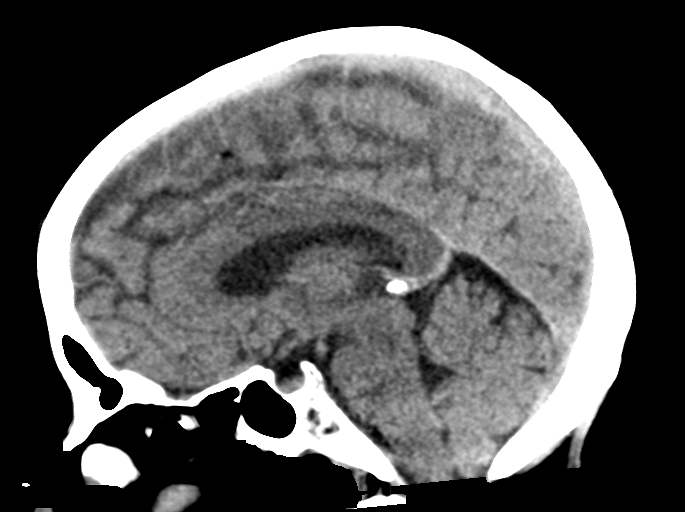
[im 35/53  brain]
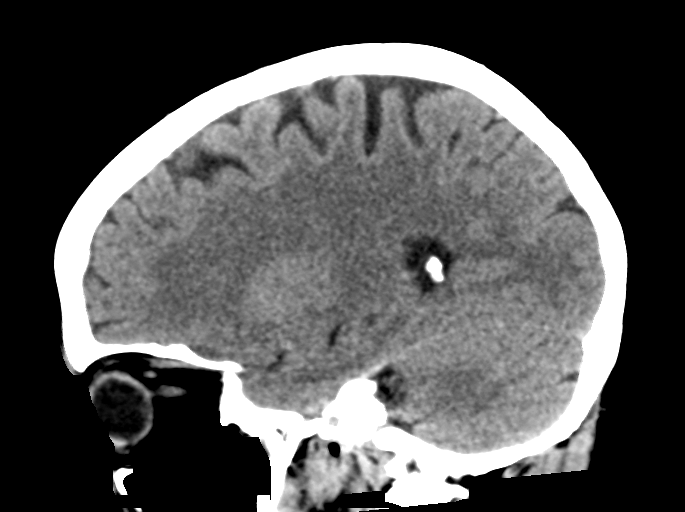

[15 of 47 positions shown; findings below may reference images not displayed]

FINDINGS: Brain: No acute intracranial findings are seen in noncontrast CT
brain. Ventricles are not dilated. There is no shift of midline
structures. There are no epidural or subdural fluid collections.
Cortical sulci are prominent.

Vascular: Unremarkable.

Skull: Unremarkable.

Sinuses/Orbits: Unremarkable.

Other: None.
IMPRESSION: No acute intracranial findings are seen in the noncontrast CT brain.
Atrophy.

## 2023-09-04 NOTE — Therapy (Signed)
OUTPATIENT PHYSICAL THERAPY KNEE EVALUATION   Patient Name: Shawna Suarez MRN: [id number] DOB:July 18, 1961, 62 y.o., female Today's Date: 09/04/2023  END OF SESSION:  PT End of Session - 09/04/23 1048     Visit Number 1    Number of Visits 17    Date for PT Re-Evaluation 10/30/23    Authorization Type [employer]    PT Start Time 1000    PT Stop Time 1045    PT Time Calculation (min) 45 min    Activity Tolerance Patient tolerated treatment well;Patient limited by pain             Past Medical History:  Diagnosis Date   Arthritis    Heart murmur    Neuromuscular disorder (HCC)    Past Surgical History:  Procedure Laterality Date   right breast biopsy  2002   TOTAL KNEE ARTHROPLASTY Left 08/18/2023   Procedure: LEFT TOTAL KNEE ARTHROPLASTY;  Surgeon: Gean Birchwood, MD;  Location: [facility];  Service: Orthopedics;  Laterality: Left;   Patient Active Problem List   Diagnosis Date Noted   Elevated cholesterol 08/17/2023   History of anemia 08/17/2023   Lumbago with sciatica, left side 08/17/2023   Degenerative arthritis of left knee 08/13/2023   Encounter for screening for other metabolic disorders 12/25/2022   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 She reports that she has been working on some exercises at home and trying to cross her left leg over the right knee. She has 9-10/10 pain and states that OTC meds are not helping the pain. She doesn't want to take prescription  medication for pain. She does report that despite being given lifting restrictions from her MD, she did move her microwave the other day. She is taking blood thinners to prevent possible DVT. She also states that she had a blister on her foot from amount of swelling that was present. She is elevating her leg at times. Patient reports that had some nerve problems on her right knee and has history of surgery to address those deficits, however she still has some difficulty with that limb.   PERTINENT HISTORY: PMHx includes arthritis, elevated cholesterol, anemia, degenerative arthritis of L knee, neuropathy of peroneal nerve at right knee, neuromuscular disorder; TAKING ANTI-COAGULANTS   PAIN:  Are you having pain?  Yes: NPRS scale: 10/10 Pain location: Left Knee Pain description: "pain"  Aggravating factors: prolonged standing, walking, lifting leg, bending Relieving factors: ice, elevating leg   PRECAUTIONS: Knee and Fall  RED FLAGS: None   WEIGHT BEARING RESTRICTIONS: No  FALLS:  Has patient fallen in last 6 months? No  LIVING ENVIRONMENT: Lives with: lives alone Lives in: House/apartment Stairs: Yes: External: 1 steps; no railing Has following equipment at home: Walker - 2 wheeled  OCCUPATION: n/a   PLOF: Independent  PATIENT GOALS: Patient would like to return to normal activities without pain.   NEXT MD VISIT: unsure, "I'm supposed to follow up in 2 weeks.'   OBJECTIVE:  Note: Objective measures were  completed at Evaluation unless otherwise noted.  DIAGNOSTIC FINDINGS:   no relevant imaging results available in epic; none included in referral    PATIENT SURVEYS:  FOTO 52 current, 63 predicted  COGNITION: Overall cognitive status: Within functional limits for tasks assessed     SENSATION: Not tested  EDEMA:  Circumferential: to be assessed at f/u visits  PALPATION:  Presence of edema around joint replacement   OTHER OBSERVATION: Scar appears to be healing  well.   LOWER EXTREMITY ROM:  Active ROM Right eval Left eval  Hip flexion    Hip extension    Hip abduction    Hip adduction    Hip internal rotation    Hip external rotation    Knee flexion  95  Knee extension  -6  Ankle dorsiflexion    Ankle plantarflexion    Ankle inversion    Ankle eversion     (Blank rows = not tested)  LOWER EXTREMITY MMT: not formally assessed at evaluation   MMT Right eval Left eval  Hip flexion    Hip extension    Hip abduction    Hip adduction    Hip internal rotation    Hip external rotation    Knee flexion  2-  Knee extension  2-  Ankle dorsiflexion    Ankle plantarflexion    Ankle inversion    Ankle eversion     (Blank rows = not tested)  FUNCTIONAL TESTS:  None performed at evaluation   GAIT: Distance walked: 25 ft Assistive device utilized: Environmental consultant - 2 wheeled Level of assistance: Modified independence Comments: intermittent festinating gait, small step length, decreased heel strike, decreased knee flexion during swing phase   TODAY'S TREATMENT:                                                                                                                               [facility] Adult PT Treatment:                                                DATE: 09/04/2023  Initial evaluation: see patient education and home exercise program as noted below    Therapeutic Exercises:  Supine quad set x 10 with 3 sec hold  Supine heel slide x 10 with 3 sec hold   PATIENT EDUCATION:  Education details: reviewed initial home exercise program; discussion of POC, prognosis and goals for skilled PT; post op prognosis, precautions, expectations, restrictions; swelling management strategies, activity pacing   Person educated: Patient Education method: Explanation Education comprehension: verbalized understanding  HOME EXERCISE PROGRAM: Access Code: [email or web address] URL: [email or web address] Date: 09/04/2023 Prepared by: Mauri Reading  Exercises - Supine Heel Slide with Strap  - 3 x daily - 7 x weekly - 2 sets - 10 reps - 3 sec hold - Supine  Quad Set  - 3 x daily - 7 x weekly - 2 sets - 10 reps - 3 sec hold - Seated Heel Slide  - 3 x daily - 7 x weekly - 2 sets - 10 reps - 3 sec hold - Seated Ankle Pumps on Table  - 3 x daily - 7 x weekly - 2 sets - 10 reps  Patient Education - Ice - TKR Precautions Handout - Pain Management  ASSESSMENT:  CLINICAL IMPRESSION: Arianie is a 62 y.o. female  who was seen today for physical therapy evaluation and treatment 2 weeks s/p Left Total Knee Replacement. She is demonstrating decreased knee AROM, decreased LE MMT, and altered gait mechanics. She has related pain and difficulty with prolonged standing, walking, stair climbing, squatting, bed mobility, and performance of normal yardwork. She requires skilled PT services at this time to address relevant deficits and improve overall function.     OBJECTIVE IMPAIRMENTS: Abnormal gait, decreased activity tolerance, decreased balance, decreased endurance, decreased mobility, difficulty walking, decreased ROM, decreased strength, decreased safety awareness, and pain.   ACTIVITY LIMITATIONS: carrying, lifting, sitting, standing, squatting, stairs, transfers, bed mobility, and locomotion level  PARTICIPATION LIMITATIONS: meal prep, cleaning, laundry, driving, shopping, community activity, occupation, and yard work  PERSONAL FACTORS: Age, Behavior pattern, Past/current experiences, Time since onset of injury/illness/exacerbation, and 1-2 comorbidities: PMHx includes arthritis, elevated cholesterol, anemia, degenerative arthritis of L knee, neuropathy of peroneal nerve at right knee  are also affecting patient's functional outcome.   REHAB POTENTIAL: Fair    CLINICAL DECISION MAKING: Stable/uncomplicated  EVALUATION COMPLEXITY: Low   GOALS: Goals reviewed with patient? Yes  SHORT TERM GOALS: Target date: 10/02/2023  Patient will be  independent with initial home program for knee AROM.  Baseline: provided at eval Goal Status: INITIAL  2.  Patient will demonstrate at least 110 knee flexion AROM and 0 degrees knee extension AROM Baseline: see objective measures Goal status: INITIAL  3.  Patient will be able to verbalize adherence to at least 2 strategies for edema management and 2 strategies for minimizing fall risk. Baseline: demonstrating lack of awareness Goal status: INITIAL  4.  Patient will demonstrate ability to perform supine-to-sit with at least Mod I.  Baseline: requires min A  Goal status: INITIAL   LONG TERM GOALS: Target date: 10/30/2023   Patient will report improved overall functional ability with FOTO score of 62 or greater.  Baseline: 52 Goal status: INITIAL  2.  Patient will demonstrate ability to safely ambulate at least 141ft in 2 MWT.  Baseline: see objective measures  Goal status: INITIAL  3.  Patient will demonstrate at least 4/5 MMT LE strength BIL.  Baseline: see objective measures Goal status: INITIAL  4.  Patient will demonstrate ability to safely navigate at least 6" step 10 times without exacerbation of symptoms.  Baseline:  Goal status: INITIAL  5.  Patient will demonstrate at least 120 knee flexion AROM.  Baseline: see objective measures Goal status: INITIAL    PLAN:  PT FREQUENCY: 2x/week  PT DURATION: 8 weeks  PLANNED INTERVENTIONS: 97164- PT Re-evaluation, 97110-Therapeutic exercises, 97530- Therapeutic activity, 97112- Neuromuscular re-education, 97535- Self Care, 60454- Manual therapy, [phone]- Gait training, Patient/Family education, Balance training, Stair training, and Cryotherapy  PLAN FOR NEXT SESSION: continue with focus on knee AROM, patient education regarding post-op precautions/considerations/expectations, begin strengthening as appropriate for post-op status, monitor swelling each visit; functional tests (STS, 2 or 6 MWT)     Mauri Reading, PT, DPT  09/04/2023, 12:58 PM   For all possible CPT codes, reference the Planned Interventions line above.     Check all conditions that are expected to impact treatment: {Conditions expected to impact treatment:Musculoskeletal disorders and Neurological condition and/or seizures   If treatment provided at initial evaluation, no treatment charged due to lack of authorization.

## 2023-09-13 ENCOUNTER — Ambulatory Visit: Payer: Medicaid Other

## 2023-09-13 DIAGNOSIS — G8929 Other chronic pain: Secondary | ICD-10-CM

## 2023-09-13 DIAGNOSIS — R2689 Other abnormalities of gait and mobility: Secondary | ICD-10-CM

## 2023-09-13 DIAGNOSIS — M25562 Pain in left knee: Secondary | ICD-10-CM | POA: Diagnosis not present

## 2023-09-13 DIAGNOSIS — R600 Localized edema: Secondary | ICD-10-CM

## 2023-09-13 DIAGNOSIS — M6281 Muscle weakness (generalized): Secondary | ICD-10-CM

## 2023-09-13 NOTE — Therapy (Signed)
OUTPATIENT PHYSICAL THERAPY NOTE   Patient Name: Shawna Suarez MRN: [id number] DOB:12-24-1960, 62 y.o., female Today's Date: 09/13/2023  END OF SESSION:  PT End of Session - 09/13/23 0956     Visit Number 2    Number of Visits 17    Date for PT Re-Evaluation 10/30/23    Authorization Type [facility] [facility]    PT Start Time [phone]    PT Stop Time 1025    PT Time Calculation (min) 39 min    Activity Tolerance Patient tolerated treatment well;Patient limited by pain              Past Medical History:  Diagnosis Date   Arthritis    Heart murmur    Neuromuscular disorder [facility])    Past Surgical History:  Procedure Laterality Date   right breast biopsy  2002   TOTAL KNEE ARTHROPLASTY Left 08/18/2023   Procedure: LEFT TOTAL KNEE ARTHROPLASTY;  Surgeon: Gean Birchwood, MD;  Location: [facility];  Service: Orthopedics;  Laterality: Left;   Patient Active Problem List   Diagnosis Date Noted   Elevated cholesterol 08/17/2023   History of anemia 08/17/2023   Lumbago with sciatica, left side 08/17/2023   Degenerative arthritis of left knee 08/13/2023   Encounter for screening for other metabolic disorders 12/25/2022   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 She has been consistent with HEP and has kept a written log of the days and times that she completed them. She did vacuum her floor since last visit.    PERTINENT HISTORY: PMHx includes arthritis, elevated cholesterol, anemia,  degenerative arthritis of L knee, neuropathy of peroneal nerve at right knee, neuromuscular disorder; TAKING ANTI-COAGULANTS   PAIN:  Are you having pain?  Yes: NPRS scale: 6/10 09/13/2023 Pain location: Left Knee Pain description: "pain"  Aggravating factors: prolonged standing, walking, lifting leg, bending Relieving factors: ice, elevating leg   PRECAUTIONS: Knee and Fall  RED FLAGS: None   WEIGHT BEARING RESTRICTIONS: No  FALLS:  Has patient fallen in last 6 months? No  LIVING ENVIRONMENT: Lives with: lives alone Lives in: House/apartment Stairs: Yes: External: 1 steps; no railing Has following equipment at home: Walker - 2 wheeled  OCCUPATION: n/a   PLOF: Independent  PATIENT GOALS: Patient would like to return to normal activities without pain.   NEXT MD VISIT: unsure, "I'm supposed to follow up in 2 weeks.'   OBJECTIVE:  Note: Objective measures were completed at Evaluation unless otherwise noted.  DIAGNOSTIC FINDINGS:   no relevant imaging results available in epic; none included in referral    PATIENT SURVEYS:  FOTO 52 current, 63 predicted  COGNITION: Overall cognitive status: Within functional limits for tasks assessed     SENSATION: Not tested  EDEMA:  Circumferential: to be assessed at f/u visits  PALPATION:  Presence of edema around joint replacement   OTHER OBSERVATION: Scar appears to be healing well.   LOWER EXTREMITY ROM:  Active ROM Right eval Left eval  Hip flexion  Hip extension    Hip abduction    Hip adduction    Hip internal rotation    Hip external rotation    Knee flexion  95  Knee extension  -6  Ankle dorsiflexion    Ankle plantarflexion    Ankle inversion    Ankle eversion     (Blank rows = not tested)  LOWER EXTREMITY MMT: not formally assessed at evaluation   MMT Right eval Left eval  Hip flexion    Hip extension    Hip abduction    Hip adduction    Hip internal rotation    Hip external rotation     Knee flexion  2-  Knee extension  2-  Ankle dorsiflexion    Ankle plantarflexion    Ankle inversion    Ankle eversion     (Blank rows = not tested)  FUNCTIONAL TESTS:  None performed at evaluation   GAIT: Distance walked: 25 ft Assistive device utilized: Environmental consultant - 2 wheeled Level of assistance: Modified independence Comments: intermittent festinating gait, small step length, decreased heel strike, decreased knee flexion during swing phase     TODAY'S TREATMENT:           [facility] Adult PT Treatment:                                                DATE: 09/13/2023  Therapeutic Exercise: Nustep x 8 minutes level 3  Supine SAQ, 3 x 10  Seated Heel Slide 2 x 10 Supine Quad Set 5 sec 2 x 10 Supine hip abduction, 2 x 10 with mod resistance from clinician  Requires cueing for appropriate pacing.    Modalities:  Ice x 10 minutes                                                                                                                       [facility] Adult PT Treatment:                                                DATE: 09/04/2023  Initial evaluation: see patient education and home exercise program as noted below    Therapeutic Exercises:  Supine quad set x 10 with 3 sec hold  Supine heel slide x 10 with 3 sec hold   PATIENT EDUCATION:  Education details: reviewed initial home exercise program; discussion of POC, prognosis and goals for skilled PT; post op prognosis, precautions, expectations, restrictions; swelling management strategies, activity pacing   Person educated: Patient Education method: Explanation Education comprehension: verbalized understanding  HOME EXERCISE PROGRAM: Access Code: [email or web address] URL: [email or web address] Date: 09/04/2023 Prepared by: Mauri Reading  Exercises - Supine Heel Slide with Strap  - 3 x daily - 7  x weekly - 2 sets - 10 reps - 3 sec hold - Supine Quad Set  - 3 x daily - 7 x weekly - 2 sets - 10 reps - 3 sec hold -  Seated Heel Slide  - 3 x daily - 7 x weekly - 2 sets - 10 reps - 3 sec hold - Seated Ankle Pumps on Table  - 3 x daily - 7 x weekly - 2 sets - 10 reps  Patient Education - Ice - TKR Precautions Handout - Pain Management  ASSESSMENT:  CLINICAL IMPRESSION:  Patient has been compliant with HEP and was able to tolerate today's treatment session well. She requires cueing for pacing and some patient education regarding appropriate activities and lower extremity motions related to current post op status. We will continue to progress as appropriate as related to post op status.    OBJECTIVE IMPAIRMENTS: Abnormal gait, decreased activity tolerance, decreased balance, decreased endurance, decreased mobility, difficulty walking, decreased ROM, decreased strength, decreased safety awareness, and pain.   ACTIVITY LIMITATIONS: carrying, lifting, sitting, standing, squatting, stairs, transfers, bed mobility, and locomotion level  PARTICIPATION LIMITATIONS: meal prep, cleaning, laundry, driving, shopping, community activity, occupation, and yard work  PERSONAL FACTORS: Age, Behavior pattern, Past/current experiences, Time since onset of injury/illness/exacerbation, and 1-2 comorbidities: PMHx includes arthritis, elevated cholesterol, anemia, degenerative arthritis of L knee, neuropathy of peroneal nerve at right knee  are also affecting patient's functional outcome.   REHAB POTENTIAL: Fair    CLINICAL DECISION MAKING: Stable/uncomplicated  EVALUATION COMPLEXITY: Low   GOALS: Goals reviewed with patient? Yes  SHORT TERM GOALS: Target date: 10/02/2023  Patient will be independent with initial home program for knee AROM.  Baseline: provided at eval Goal Status: INITIAL  2.  Patient will demonstrate at least 110 knee flexion AROM and 0 degrees knee extension AROM Baseline: see objective measures Goal status: INITIAL  3.  Patient will be able to verbalize adherence to at least 2 strategies for  edema management and 2 strategies for minimizing fall risk. Baseline: demonstrating lack of awareness Goal status: INITIAL  4.  Patient will demonstrate ability to perform supine-to-sit with at least Mod I.  Baseline: requires min A  Goal status: INITIAL   LONG TERM GOALS: Target date: 10/30/2023   Patient will report improved overall functional ability with FOTO score of 62 or greater.  Baseline: 52 Goal status: INITIAL  2.  Patient will demonstrate ability to safely ambulate at least 184ft in 2 MWT.  Baseline: see objective measures  Goal status: INITIAL  3.  Patient will demonstrate at least 4/5 MMT LE strength BIL.  Baseline: see objective measures Goal status: INITIAL  4.  Patient will demonstrate ability to safely navigate at least 6" step 10 times without exacerbation of symptoms.  Baseline:  Goal status: INITIAL  5.  Patient will demonstrate at least 120 knee flexion AROM.  Baseline: see objective measures Goal status: INITIAL    PLAN:  PT FREQUENCY: 2x/week  PT DURATION: 8 weeks  PLANNED INTERVENTIONS: 97164- PT Re-evaluation, 97110-Therapeutic exercises, 97530- Therapeutic activity, 97112- Neuromuscular re-education, 97535- Self Care, 47829- Manual therapy, [phone]- Gait training, Patient/Family education, Balance training, Stair training, and Cryotherapy  PLAN FOR NEXT SESSION: continue with focus on knee AROM, patient education regarding post-op precautions/considerations/expectations, begin strengthening as appropriate for post-op status, monitor swelling each visit; functional tests (STS, 2 or 6 MWT)     Mauri Reading, PT, DPT   09/13/2023, 12:18 PM   For all possible CPT  codes, reference the Planned Interventions line above.     Check all conditions that are expected to impact treatment: {Conditions expected to impact treatment:Musculoskeletal disorders and Neurological condition and/or seizures   If treatment provided at initial evaluation, no  treatment charged due to lack of authorization.

## 2023-09-15 ENCOUNTER — Ambulatory Visit: Payer: Medicaid Other

## 2023-09-15 DIAGNOSIS — R600 Localized edema: Secondary | ICD-10-CM

## 2023-09-15 DIAGNOSIS — R2689 Other abnormalities of gait and mobility: Secondary | ICD-10-CM

## 2023-09-15 DIAGNOSIS — M6281 Muscle weakness (generalized): Secondary | ICD-10-CM

## 2023-09-15 DIAGNOSIS — G8929 Other chronic pain: Secondary | ICD-10-CM

## 2023-09-15 DIAGNOSIS — M25562 Pain in left knee: Secondary | ICD-10-CM | POA: Diagnosis not present

## 2023-09-15 NOTE — Therapy (Signed)
OUTPATIENT PHYSICAL THERAPY NOTE   Patient Name: Shawna Suarez MRN: [id number] DOB:Oct 24, 1960, 62 y.o., female Today's Date: 09/15/2023  END OF SESSION:  PT End of Session - 09/15/23 0947     Visit Number 3    Number of Visits 17    Date for PT Re-Evaluation 10/30/23    Authorization Type [employer]    Authorization Time Period 13 visits approved 09/08/23-12/07/23    Authorization - Visit Number 2    Authorization - Number of Visits 13    PT Start Time 1000    PT Stop Time 1038    PT Time Calculation (min) 38 min    Activity Tolerance Patient tolerated treatment well;Patient limited by pain    Behavior During Therapy [facility] for tasks assessed/performed              Past Medical History:  Diagnosis Date   Arthritis    Heart murmur    Neuromuscular disorder (HCC)    Past Surgical History:  Procedure Laterality Date   right breast biopsy  2002   TOTAL KNEE ARTHROPLASTY Left 08/18/2023   Procedure: LEFT TOTAL KNEE ARTHROPLASTY;  Surgeon: Gean Birchwood, MD;  Location: [facility];  Service: Orthopedics;  Laterality: Left;   Patient Active Problem List   Diagnosis Date Noted   Elevated cholesterol 08/17/2023   History of anemia 08/17/2023   Lumbago with sciatica, left side 08/17/2023   Degenerative arthritis of left knee 08/13/2023   Encounter for screening for other metabolic disorders 12/25/2022   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    PERTINENT HISTORY: PMHx includes arthritis, elevated cholesterol, anemia, degenerative arthritis of L knee, neuropathy of peroneal nerve at right knee, neuromuscular disorder; TAKING ANTI-COAGULANTS   PAIN:  Are you having pain?  Yes: NPRS scale: 6.5/10  Pain location: Left Knee Pain description: "pain"  Aggravating factors: prolonged standing, walking, lifting leg, bending Relieving factors: ice, elevating leg   PRECAUTIONS: Knee and Fall  RED FLAGS: None   WEIGHT BEARING RESTRICTIONS: No  FALLS:  Has patient fallen in last 6 months? No  LIVING ENVIRONMENT: Lives with: lives alone Lives in: House/apartment Stairs: Yes: External: 1 steps; no railing Has following equipment at home: Walker - 2 wheeled  OCCUPATION: n/a   PLOF: Independent  PATIENT GOALS: Patient would like to return to normal activities without pain.   NEXT MD VISIT: unsure, "I'm supposed to follow up in 2 weeks.'   OBJECTIVE:  Note: Objective measures were completed at Evaluation unless otherwise noted.  DIAGNOSTIC FINDINGS:   no relevant imaging results available in epic; none included in referral    PATIENT SURVEYS:  FOTO 52 current, 63 predicted  COGNITION: Overall cognitive status: Within functional limits for tasks assessed     SENSATION: Not tested  EDEMA:  Circumferential: to be assessed at f/u visits  PALPATION:  Presence of edema around joint replacement   OTHER OBSERVATION: Scar appears to be healing well.   LOWER  EXTREMITY ROM:  Active ROM Right eval Left eval Left 09/15/23  Hip flexion     Hip extension     Hip abduction     Hip adduction     Hip internal rotation     Hip external rotation     Knee flexion  95 109  Knee extension  -6   Ankle dorsiflexion     Ankle plantarflexion     Ankle inversion     Ankle eversion      (Blank rows = not tested)  LOWER EXTREMITY MMT: not formally assessed at evaluation   MMT Right eval  Left eval  Hip flexion    Hip extension    Hip abduction    Hip adduction    Hip internal rotation    Hip external rotation    Knee flexion  2-  Knee extension  2-  Ankle dorsiflexion    Ankle plantarflexion    Ankle inversion    Ankle eversion     (Blank rows = not tested)  FUNCTIONAL TESTS:  None performed at evaluation   GAIT: Distance walked: 25 ft Assistive device utilized: Environmental consultant - 2 wheeled Level of assistance: Modified independence Comments: intermittent festinating gait, small step length, decreased heel strike, decreased knee flexion during swing phase     TODAY'S TREATMENT:   [facility] Adult PT Treatment:                                                DATE: 09/15/23 Therapeutic Exercise: (supine exercises performed on reclined on table due to dizziness) Nustep x 5 minutes level 3  Supine SAQ, 3 x 10  Seated Heel Slide 2 x 10 SLR with QS Lt 2x10 Supine Quad Set 5 sec 2 x 10 Supine heel slide with strap x10 Therapeutic Activity: 218 ft  Requires cueing for appropriate pacing.      [facility] Adult PT Treatment:                                                DATE: 09/13/2023  Therapeutic Exercise: Nustep x 8 minutes level 3  Supine SAQ, 3 x 10  Seated Heel Slide 2 x 10 Supine Quad Set 5 sec 2 x 10 Supine hip abduction, 2 x 10 with mod resistance from clinician  Requires cueing for appropriate pacing.    Modalities:  Ice x 10 minutes                                                                                                                       [facility] Adult PT Treatment:  DATE: 09/04/2023  Initial evaluation: see patient education and home exercise program as noted below    Therapeutic Exercises:  Supine quad set x 10 with 3 sec hold  Supine heel slide x 10 with 3 sec hold   PATIENT EDUCATION:  Education details: reviewed initial home exercise program; discussion of POC, prognosis and goals for  skilled PT; post op prognosis, precautions, expectations, restrictions; swelling management strategies, activity pacing   Person educated: Patient Education method: Explanation Education comprehension: verbalized understanding  HOME EXERCISE PROGRAM: Access Code: [email or web address] URL: [email or web address] Date: 09/04/2023 Prepared by: Mauri Reading  Exercises - Supine Heel Slide with Strap  - 3 x daily - 7 x weekly - 2 sets - 10 reps - 3 sec hold - Supine Quad Set  - 3 x daily - 7 x weekly - 2 sets - 10 reps - 3 sec hold - Seated Heel Slide  - 3 x daily - 7 x weekly - 2 sets - 10 reps - 3 sec hold - Seated Ankle Pumps on Table  - 3 x daily - 7 x weekly - 2 sets - 10 reps  Patient Education - Ice - TKR Precautions Handout - Pain Management  ASSESSMENT:  CLINICAL IMPRESSION: Patient presents to PT reporting continued knee pain and that she had some soreness after her last session. Session today continued to focus on quad contraction and knee ROM. performed today with patient achieving 218 ft, antalgic gait and she tends to land very flat foot on LLE. She was able to achieve 109 knee flexion today. Patient continues to benefit from skilled PT services and should be progressed as able to improve functional independence.    OBJECTIVE IMPAIRMENTS: Abnormal gait, decreased activity tolerance, decreased balance, decreased endurance, decreased mobility, difficulty walking, decreased ROM, decreased strength, decreased safety awareness, and pain.   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   REHAB POTENTIAL: Fair    CLINICAL DECISION MAKING: Stable/uncomplicated  EVALUATION COMPLEXITY: Low   GOALS: Goals reviewed with patient? Yes  SHORT TERM GOALS: Target date: 10/02/2023  Patient will be independent with initial home program for knee AROM.  Baseline: provided at eval Goal Status: INITIAL  2.  Patient will demonstrate at least 110 knee flexion AROM and 0 degrees knee extension AROM Baseline: see objective measures Goal status: INITIAL  3.  Patient will be able to verbalize adherence to at least 2 strategies for edema management and 2 strategies for minimizing fall risk. Baseline: demonstrating lack of awareness Goal status: INITIAL  4.  Patient will demonstrate ability to perform supine-to-sit with at least Mod I.  Baseline: requires min A  Goal status: INITIAL   LONG TERM GOALS: Target date: 10/30/2023   Patient will report improved overall functional ability with FOTO score of 62 or greater.  Baseline: 52 Goal status: INITIAL  2.  Patient will demonstrate ability to safely ambulate at least 161ft in 2 MWT.  Baseline: see objective measures  Goal status: INITIAL  3.  Patient will demonstrate at least 4/5 MMT LE strength BIL.  Baseline: see objective measures Goal status: INITIAL  4.  Patient  will demonstrate ability to safely navigate at least 6" step 10 times without exacerbation of symptoms.  Baseline:  Goal status: INITIAL  5.  Patient will demonstrate at least 120 knee flexion AROM.  Baseline: see objective measures Goal status: INITIAL    PLAN:  PT FREQUENCY: 2x/week  PT DURATION: 8 weeks  PLANNED INTERVENTIONS: 97164- PT Re-evaluation, 97110-Therapeutic exercises, 97530- Therapeutic activity, 97112- Neuromuscular re-education, 97535- Self Care, 74259- Manual therapy, [phone]- Gait training, Patient/Family  education, Balance training, Stair training, and Cryotherapy  PLAN FOR NEXT SESSION: continue with focus on knee AROM, patient education regarding post-op precautions/considerations/expectations, begin strengthening as appropriate for post-op status, monitor swelling each visit; functional tests (STS, 2 or 6 MWT)     Berta Minor PTA 09/15/2023, 10:41 AM   For all possible CPT codes, reference the Planned Interventions line above.     Check all conditions that are expected to impact treatment: {Conditions expected to impact treatment:Musculoskeletal disorders and Neurological condition and/or seizures   If treatment provided at initial evaluation, no treatment charged due to lack of authorization.

## 2023-09-26 ENCOUNTER — Other Ambulatory Visit: Payer: Self-pay | Admitting: Adult Health

## 2023-09-27 ENCOUNTER — Ambulatory Visit: Payer: Medicaid Other | Attending: Orthopedic Surgery

## 2023-09-27 DIAGNOSIS — G8929 Other chronic pain: Secondary | ICD-10-CM | POA: Insufficient documentation

## 2023-09-27 DIAGNOSIS — M25562 Pain in left knee: Secondary | ICD-10-CM | POA: Diagnosis present

## 2023-09-27 DIAGNOSIS — M6281 Muscle weakness (generalized): Secondary | ICD-10-CM | POA: Insufficient documentation

## 2023-09-27 DIAGNOSIS — R2689 Other abnormalities of gait and mobility: Secondary | ICD-10-CM | POA: Diagnosis present

## 2023-09-27 DIAGNOSIS — R600 Localized edema: Secondary | ICD-10-CM | POA: Diagnosis present

## 2023-09-27 NOTE — Therapy (Signed)
 OUTPATIENT PHYSICAL THERAPY NOTE   Patient Name: Shawna Suarez MRN: [id number] DOB:[birth year], 63 y.o., female Today's Date: 09/27/2023  END OF SESSION:  PT End of Session - 09/27/23 0851     Visit Number 4    Number of Visits 17    Date for PT Re-Evaluation 10/30/23    Authorization Type [employer]    Authorization Time Period 13 visits approved 09/08/23-12/07/23    Authorization - Visit Number 3    Authorization - Number of Visits 13    PT Start Time 0854    PT Stop Time 0932    PT Time Calculation (min) 38 min    Activity Tolerance Patient tolerated treatment well;Patient limited by pain    Behavior During Therapy [facility] for tasks assessed/performed               Past Medical History:  Diagnosis Date   Arthritis    Heart murmur    Neuromuscular disorder (HCC)    Past Surgical History:  Procedure Laterality Date   right breast biopsy  2002   TOTAL KNEE ARTHROPLASTY Left 08/18/2023   Procedure: LEFT TOTAL KNEE ARTHROPLASTY;  Surgeon: Liam Lerner, MD;  Location: [facility];  Service: Orthopedics;  Laterality: Left;   Patient Active Problem List   Diagnosis Date Noted   Elevated cholesterol 08/17/2023   History of anemia 08/17/2023   Lumbago with sciatica, left side 08/17/2023   Degenerative arthritis of left knee 08/13/2023   Encounter for screening for other metabolic disorders 12/25/2022   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    PERTINENT HISTORY: PMHx includes arthritis, elevated cholesterol, anemia, degenerative arthritis of L knee, neuropathy of peroneal nerve at right knee, neuromuscular disorder; TAKING ANTI-COAGULANTS   PAIN:  Are you having pain?  Yes: NPRS scale: 6.5/10  Pain location: Left Knee Pain description: pain  Aggravating factors: prolonged standing, walking, lifting leg, bending Relieving factors: ice, elevating leg   PRECAUTIONS: Knee and Fall  RED FLAGS: None   WEIGHT BEARING RESTRICTIONS: No  FALLS:  Has patient fallen in last 6 months? No  LIVING ENVIRONMENT: Lives with: lives alone Lives in: House/apartment Stairs: Yes: External: 1 steps; no railing Has following equipment at home: Walker - 2 wheeled  OCCUPATION: n/a   PLOF: Independent  PATIENT GOALS: Patient would like to return to normal activities without pain.   NEXT MD VISIT: unsure, I'm supposed to follow up in 2 weeks.'   OBJECTIVE:  Note: Objective measures were completed at Evaluation unless otherwise noted.  DIAGNOSTIC FINDINGS:   no relevant imaging results available in epic; none included in referral    PATIENT SURVEYS:  FOTO 52 current, 63 predicted  COGNITION: Overall cognitive status: Within functional limits for tasks assessed     SENSATION: Not tested  EDEMA:  Circumferential: to be assessed at f/u visits  PALPATION:  Presence of edema around joint replacement   OTHER OBSERVATION: Scar appears to be healing well.  LOWER EXTREMITY ROM:  Active ROM Right eval Left eval Left 09/15/23  Hip flexion     Hip extension     Hip abduction     Hip adduction     Hip internal rotation     Hip external rotation     Knee flexion  95 109  Knee extension  -6   Ankle dorsiflexion     Ankle plantarflexion     Ankle inversion     Ankle eversion      (Blank rows = not tested)  LOWER EXTREMITY MMT: not formally assessed at evaluation   MMT Right eval Left eval  Hip flexion     Hip extension    Hip abduction    Hip adduction    Hip internal rotation    Hip external rotation    Knee flexion  2-  Knee extension  2-  Ankle dorsiflexion    Ankle plantarflexion    Ankle inversion    Ankle eversion     (Blank rows = not tested)  FUNCTIONAL TESTS:  None performed at evaluation   GAIT: Distance walked: 25 ft Assistive device utilized: Environmental Consultant - 2 wheeled Level of assistance: Modified independence Comments: intermittent festinating gait, small step length, decreased heel strike, decreased knee flexion during swing phase     TODAY'S TREATMENT:    [facility] Adult PT Treatment:                                                DATE: 09/27/2023  Therapeutic Exercise: (supine exercises performed on reclined on table due to dizziness) Nustep x 7 minutes level 4 Seated Heel Slide 2 x 10 Supine SAQ, 2 x 15, 5 sec   Forward Step Ups x 15 onto Left, 4 step Lateral Step Ups x 15 onto Left, 4 step  Sit to stand, 2 x 10  Updated HEP  Patient education regarding post-op progress and expectations, including ongoing pain and resting with knee extension  Requires cueing for appropriate pacing.   [facility] Adult PT Treatment:                                                DATE: 09/15/23 Therapeutic Exercise: (supine exercises performed on reclined on table due to dizziness) Nustep x 5 minutes level 3  Supine SAQ, 3 x 10  Seated Heel Slide 2 x 10 SLR with QS Lt 2x10 Supine Quad Set 5 sec 2 x 10 Supine heel slide with strap x10  Therapeutic Activity: 218 ft  Requires cueing for appropriate pacing.      [facility] Adult PT Treatment:                                                DATE: 09/13/2023  Therapeutic Exercise: Nustep x 8 minutes level 3  Supine SAQ, 3 x 10  Seated Heel Slide 2 x 10 Supine Quad Set 5 sec 2 x 10 Supine hip abduction, 2 x 10 with mod resistance from clinician  Requires cueing for appropriate pacing.    Modalities:  Ice x 10 minutes  [facility] Adult PT Treatment:                                                DATE: 09/04/2023  Initial evaluation: see patient education and home exercise program as noted below    Therapeutic Exercises:  Supine quad set x 10 with 3 sec hold  Supine heel slide x 10 with 3 sec hold   PATIENT EDUCATION:  Education details: reviewed initial home exercise program; discussion of POC, prognosis and goals for skilled PT; post op prognosis, precautions, expectations, restrictions; swelling management strategies, activity pacing   Person educated: Patient Education method: Explanation Education comprehension: verbalized understanding  HOME EXERCISE PROGRAM: Access Code: [email or web address] URL: [email or web address] Date: 09/04/2023 Prepared by: Marko Molt  Exercises - Supine Heel Slide with Strap  - 3 x daily - 7 x weekly - 2 sets - 10 reps - 3 sec hold - Supine Quad Set  - 3 x daily - 7 x weekly - 2 sets - 10 reps - 3 sec hold - Seated Heel Slide  - 3 x daily - 7 x weekly - 2 sets - 10 reps - 3 sec hold - Seated Ankle Pumps on Table  - 3 x daily - 7 x weekly - 2 sets - 10 reps  Patient Education - Ice - TKR Precautions Handout - Pain Management  ASSESSMENT:  CLINICAL IMPRESSION: Patient was able to progress to sit to stand and step ups today without increased pain. She continues to require patient education regarding post op status, appropriate activity pacing/modifications, and other expectations for healing and pain. We will continue to progress exercises for mobility and strengthening as appropriate for post op status.   OBJECTIVE IMPAIRMENTS: Abnormal gait, decreased activity tolerance, decreased balance, decreased endurance, decreased mobility, difficulty walking, decreased ROM, decreased strength, decreased safety awareness, and pain.   ACTIVITY LIMITATIONS: carrying,  lifting, sitting, standing, squatting, stairs, transfers, bed mobility, and locomotion level  PARTICIPATION LIMITATIONS: meal prep, cleaning, laundry, driving, shopping, community activity, occupation, and yard work  PERSONAL FACTORS: Age, Behavior pattern, Past/current experiences, Time since onset of injury/illness/exacerbation, and 1-2 comorbidities: PMHx includes arthritis, elevated cholesterol, anemia, degenerative arthritis of L knee, neuropathy of peroneal nerve at right knee  are also affecting patient's functional outcome.   REHAB POTENTIAL: Fair    CLINICAL DECISION MAKING: Stable/uncomplicated  EVALUATION COMPLEXITY: Low   GOALS: Goals reviewed with patient? Yes  SHORT TERM GOALS: Target date: 10/02/2023  Patient will be independent with initial home program for knee AROM.  Baseline: provided at eval Goal Status: INITIAL  2.  Patient will demonstrate at least 110 knee flexion AROM and 0 degrees knee extension AROM Baseline: see objective measures Goal status: INITIAL  3.  Patient will be able to verbalize adherence to at least 2 strategies for edema management and 2 strategies for minimizing fall risk. Baseline: demonstrating lack of awareness Goal status: INITIAL  4.  Patient will demonstrate ability to perform supine-to-sit with at least Mod I.  Baseline: requires min A  Goal status: INITIAL   LONG TERM GOALS: Target date: 10/30/2023   Patient will report improved overall functional ability with FOTO score of 62 or greater.  Baseline: 52 Goal status: INITIAL  2.  Patient will demonstrate ability to safely ambulate at least 198ft in 2 MWT.  Baseline: see objective measures  Goal status: INITIAL  3.  Patient will demonstrate at least 4/5 MMT LE strength BIL.  Baseline: see objective measures Goal status: INITIAL  4.  Patient will demonstrate ability to safely navigate at least 6 step 10 times without exacerbation of symptoms.  Baseline:  Goal status:  INITIAL  5.  Patient will demonstrate at least 120 knee flexion AROM.  Baseline: see objective measures Goal status: INITIAL    PLAN:  PT FREQUENCY: 2x/week  PT DURATION: 8 weeks  PLANNED INTERVENTIONS: 97164- PT Re-evaluation, 97110-Therapeutic exercises, 97530- Therapeutic activity, 97112- Neuromuscular re-education, 97535- Self Care, [id number]- Manual therapy, [phone]- Gait training, Patient/Family education, Balance training, Stair training, and Cryotherapy  PLAN FOR NEXT SESSION: continue with focus on knee AROM, patient education regarding post-op precautions/considerations/expectations, begin strengthening as appropriate for post-op status, monitor swelling each visit; functional tests (STS, 2 or 6 MWT)     Marko Molt, PT, DPT  09/27/2023 9:48 AM   For all possible CPT codes, reference the Planned Interventions line above.     Check all conditions that are expected to impact treatment: {Conditions expected to impact treatment:Musculoskeletal disorders and Neurological condition and/or seizures   If treatment provided at initial evaluation, no treatment charged due to lack of authorization.

## 2023-09-30 ENCOUNTER — Ambulatory Visit: Payer: Medicaid Other

## 2023-09-30 DIAGNOSIS — M6281 Muscle weakness (generalized): Secondary | ICD-10-CM

## 2023-09-30 DIAGNOSIS — M25562 Pain in left knee: Secondary | ICD-10-CM | POA: Diagnosis not present

## 2023-09-30 DIAGNOSIS — G8929 Other chronic pain: Secondary | ICD-10-CM

## 2023-09-30 NOTE — Therapy (Signed)
 OUTPATIENT PHYSICAL THERAPY NOTE   Patient Name: Shawna Suarez MRN: [id number] DOB:05-16-1961, 63 y.o., female Today's Date: 09/30/2023  END OF SESSION:  PT End of Session - 09/30/23 1001     Visit Number 5    Number of Visits 17    Date for PT Re-Evaluation 10/30/23    Authorization Type [employer]    Authorization Time Period 13 visits approved 09/08/23-12/07/23    Authorization - Visit Number 4    Authorization - Number of Visits 13    PT Start Time 1001    PT Stop Time 1040    PT Time Calculation (min) 39 min    Activity Tolerance Patient tolerated treatment well;Patient limited by pain    Behavior During Therapy [facility] for tasks assessed/performed               Past Medical History:  Diagnosis Date   Arthritis    Heart murmur    Neuromuscular disorder (HCC)    Past Surgical History:  Procedure Laterality Date   right breast biopsy  2002   TOTAL KNEE ARTHROPLASTY Left 08/18/2023   Procedure: LEFT TOTAL KNEE ARTHROPLASTY;  Surgeon: Liam Lerner, MD;  Location: [facility];  Service: Orthopedics;  Laterality: Left;   Patient Active Problem List   Diagnosis Date Noted   Elevated cholesterol 08/17/2023   History of anemia 08/17/2023   Lumbago with sciatica, left side 08/17/2023   Degenerative arthritis of left knee 08/13/2023   Encounter for screening for other metabolic disorders 12/25/2022   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   PERTINENT HISTORY: PMHx includes arthritis,  elevated cholesterol, anemia, degenerative arthritis of L knee, neuropathy of peroneal nerve at right knee, neuromuscular disorder; TAKING ANTI-COAGULANTS   PAIN:  Are you having pain?  Yes: NPRS scale: 6.5/10  Pain location: Left Knee Pain description: pain  Aggravating factors: prolonged standing, walking, lifting leg, bending Relieving factors: ice, elevating leg   PRECAUTIONS: Knee and Fall  RED FLAGS: None   WEIGHT BEARING RESTRICTIONS: No  FALLS:  Has patient fallen in last 6 months? No  LIVING ENVIRONMENT: Lives with: lives alone Lives in: House/apartment Stairs: Yes: External: 1 steps; no railing Has following equipment at home: Walker - 2 wheeled  OCCUPATION: n/a   PLOF: Independent  PATIENT GOALS: Patient would like to return to normal activities without pain.   NEXT MD VISIT: unsure, I'm supposed to follow up in 2 weeks.'   OBJECTIVE:  Note: Objective measures were completed at Evaluation unless otherwise noted.  DIAGNOSTIC FINDINGS:   no relevant imaging results available in epic; none included in referral    PATIENT SURVEYS:  FOTO 52 current, 64 predicted  COGNITION: Overall cognitive status: Within functional limits for tasks assessed     SENSATION: Not tested  EDEMA:  Circumferential: to be assessed at f/u visits  PALPATION:  Presence of edema around joint replacement   OTHER OBSERVATION: Scar appears to be healing well.   LOWER EXTREMITY ROM:  Active ROM Right eval Left eval Left  09/15/23  Hip flexion     Hip extension     Hip abduction     Hip adduction     Hip internal rotation     Hip external rotation     Knee flexion  95 109  Knee extension  -6   Ankle dorsiflexion     Ankle plantarflexion     Ankle inversion     Ankle eversion      (Blank rows = not tested)  LOWER EXTREMITY MMT: not formally assessed at evaluation   MMT Right eval Left eval  Hip flexion    Hip extension    Hip abduction    Hip adduction     Hip internal rotation    Hip external rotation    Knee flexion  2-  Knee extension  2-  Ankle dorsiflexion    Ankle plantarflexion    Ankle inversion    Ankle eversion     (Blank rows = not tested)  FUNCTIONAL TESTS:  None performed at evaluation   GAIT: Distance walked: 25 ft Assistive device utilized: Environmental Consultant - 2 wheeled Level of assistance: Modified independence Comments: intermittent festinating gait, small step length, decreased heel strike, decreased knee flexion during swing phase     TODAY'S TREATMENT:    [facility] Adult PT Treatment:                                                DATE: 09/30/2023  Therapeutic Exercise: (supine exercises performed on reclined on table due to dizziness) Nustep x 7 minutes level 3-4 Sit to stand, 3 x 10  Supine SAQ, 3 x10, 5 sec   Supine SLR, 3 x 10, 5 sec Forward Step Ups x 10 BIL, 4 step Lateral Step Ups x 10 onto BIL, 4 step  Standing hip abduction x 10 each LE Standing hip extension x 10 each LE    [facility] Adult PT Treatment:                                                DATE: 09/27/2023  Therapeutic Exercise: (supine exercises performed on reclined on table due to dizziness) Nustep x 7 minutes level 4 Seated Heel Slide 2 x 10 Supine SAQ, 2 x 15, 5 sec   Forward Step Ups x 15 onto Left, 4 step Lateral Step Ups x 15 onto Left, 4 step  Sit to stand, 2 x 10  Updated HEP  Patient education regarding post-op progress and expectations, including ongoing pain and resting with knee extension  Requires cueing for appropriate pacing.   [facility] Adult PT Treatment:                                                DATE: 09/15/23 Therapeutic Exercise: (supine exercises performed on reclined on table due to dizziness) Nustep x 5 minutes level 3  Supine SAQ, 3 x 10  Seated Heel Slide 2 x 10 SLR with QS Lt 2x10 Supine Quad Set 5 sec 2 x 10 Supine heel slide with strap x10  Therapeutic Activity: 218 ft  Requires cueing  for appropriate  pacing.        PATIENT EDUCATION:  Education details: reviewed initial home exercise program; discussion of POC, prognosis and goals for skilled PT; post op prognosis, precautions, expectations, restrictions; swelling management strategies, activity pacing   Person educated: Patient Education method: Explanation Education comprehension: verbalized understanding  HOME EXERCISE PROGRAM: Access Code: [email or web address] URL: [email or web address] Date: 09/04/2023 Prepared by: Marko Molt  Exercises - Supine Heel Slide with Strap  - 3 x daily - 7 x weekly - 2 sets - 10 reps - 3 sec hold - Supine Quad Set  - 3 x daily - 7 x weekly - 2 sets - 10 reps - 3 sec hold - Seated Heel Slide  - 3 x daily - 7 x weekly - 2 sets - 10 reps - 3 sec hold - Seated Ankle Pumps on Table  - 3 x daily - 7 x weekly - 2 sets - 10 reps  Patient Education - Ice - TKR Precautions Handout - Pain Management  ASSESSMENT:  CLINICAL IMPRESSION: Patient is currently 6 weeks post-op and was due for updated POC including hip strengthening exercises with cueing for quad mm activation. She was able to tolerate these well, but did have some report of soreness at end of session. She will continue to benefit from updates to POC as appropriate for current post op status.    OBJECTIVE IMPAIRMENTS: Abnormal gait, decreased activity tolerance, decreased balance, decreased endurance, decreased mobility, difficulty walking, decreased ROM, decreased strength, decreased safety awareness, and pain.   ACTIVITY LIMITATIONS: carrying, lifting, sitting, standing, squatting, stairs, transfers, bed mobility, and locomotion level  PARTICIPATION LIMITATIONS: meal prep, cleaning, laundry, driving, shopping, community activity, occupation, and yard work  PERSONAL FACTORS: Age, Behavior pattern, Past/current experiences, Time since onset of injury/illness/exacerbation, and 1-2 comorbidities: PMHx includes arthritis, elevated  cholesterol, anemia, degenerative arthritis of L knee, neuropathy of peroneal nerve at right knee  are also affecting patient's functional outcome.   REHAB POTENTIAL: Fair    CLINICAL DECISION MAKING: Stable/uncomplicated  EVALUATION COMPLEXITY: Low   GOALS: Goals reviewed with patient? Yes  SHORT TERM GOALS: Target date: 10/02/2023  Patient will be independent with initial home program for knee AROM.  Baseline: provided at eval Goal Status: MET 09/30/23  2.  Patient will demonstrate at least 110 knee flexion AROM and 0 degrees knee extension AROM Baseline: see objective measures Goal status: INITIAL  3.  Patient will be able to verbalize adherence to at least 2 strategies for edema management and 2 strategies for minimizing fall risk. Baseline: demonstrating lack of awareness Goal status: INITIAL  4.  Patient will demonstrate ability to perform supine-to-sit with at least Mod I.  Baseline: requires min A  Goal status: MET 09/30/23   LONG TERM GOALS: Target date: 10/30/2023   Patient will report improved overall functional ability with FOTO score of 62 or greater.  Baseline: 52 Goal status: INITIAL  2.  Patient will demonstrate ability to safely ambulate at least 142ft in 2 MWT.  Baseline: see objective measures  Goal status: INITIAL  3.  Patient will demonstrate at least 4/5 MMT LE strength BIL.  Baseline: see objective measures Goal status: INITIAL  4.  Patient will demonstrate ability to safely navigate at least 6 step 10 times without exacerbation of symptoms.  Baseline:  Goal status: INITIAL  5.  Patient will demonstrate at least 120 knee flexion AROM.  Baseline: see objective measures Goal status: INITIAL    PLAN:  PT FREQUENCY: 2x/week  PT DURATION: 8 weeks  PLANNED INTERVENTIONS: 97164- PT Re-evaluation, 97110-Therapeutic exercises, 97530- Therapeutic activity, 97112- Neuromuscular re-education, 97535- Self Care, 97140- Manual therapy, [phone]- Gait  training, Patient/Family education, Balance training, Stair training, and Cryotherapy  PLAN FOR NEXT SESSION: continue with focus on knee AROM, patient education regarding post-op precautions/considerations/expectations, begin strengthening as appropriate for post-op status, monitor swelling each visit; functional tests (STS, 2 or 6 MWT)     Marko Molt, PT, DPT  09/30/2023 10:57 AM   For all possible CPT codes, reference the Planned Interventions line above.     Check all conditions that are expected to impact treatment: {Conditions expected to impact treatment:Musculoskeletal disorders and Neurological condition and/or seizures   If treatment provided at initial evaluation, no treatment charged due to lack of authorization.

## 2023-10-03 ENCOUNTER — Ambulatory Visit: Payer: Medicaid Other | Admitting: Physical Therapy

## 2023-10-03 ENCOUNTER — Encounter: Payer: Self-pay | Admitting: Physical Therapy

## 2023-10-03 DIAGNOSIS — R600 Localized edema: Secondary | ICD-10-CM

## 2023-10-03 DIAGNOSIS — R2689 Other abnormalities of gait and mobility: Secondary | ICD-10-CM

## 2023-10-03 DIAGNOSIS — M6281 Muscle weakness (generalized): Secondary | ICD-10-CM

## 2023-10-03 DIAGNOSIS — M25562 Pain in left knee: Secondary | ICD-10-CM | POA: Diagnosis not present

## 2023-10-03 DIAGNOSIS — G8929 Other chronic pain: Secondary | ICD-10-CM

## 2023-10-03 NOTE — Therapy (Signed)
 OUTPATIENT PHYSICAL THERAPY NOTE   Patient Name: Shawna Suarez MRN: [id number] DOB:10-08-1960, 63 y.o., female Today's Date: 10/03/2023  END OF SESSION:  PT End of Session - 10/03/23 0957     Visit Number 6    Number of Visits 17    Date for PT Re-Evaluation 10/30/23    Authorization Type [employer]    Authorization Time Period 13 visits approved 09/08/23-12/07/23    Authorization - Visit Number 5    Authorization - Number of Visits 13    PT Start Time 1000    PT Stop Time 1041    PT Time Calculation (min) 41 min    Activity Tolerance Patient tolerated treatment well;Patient limited by pain    Behavior During Therapy [facility] for tasks assessed/performed               Past Medical History:  Diagnosis Date   Arthritis    Heart murmur    Neuromuscular disorder (HCC)    Past Surgical History:  Procedure Laterality Date   right breast biopsy  2002   TOTAL KNEE ARTHROPLASTY Left 08/18/2023   Procedure: LEFT TOTAL KNEE ARTHROPLASTY;  Surgeon: Liam Lerner, MD;  Location: [facility];  Service: Orthopedics;  Laterality: Left;   Patient Active Problem List   Diagnosis Date Noted   Elevated cholesterol 08/17/2023   History of anemia 08/17/2023   Lumbago with sciatica, left side 08/17/2023   Degenerative arthritis of left knee 08/13/2023   Encounter for screening for other metabolic disorders 12/25/2022   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   PERTINENT  HISTORY: PMHx includes arthritis, elevated cholesterol, anemia, degenerative arthritis of L knee, neuropathy of peroneal nerve at right knee, neuromuscular disorder; TAKING ANTI-COAGULANTS   PAIN:  Are you having pain?  Yes: NPRS scale: 6.5/10  Pain location: Left Knee Pain description: pain  Aggravating factors: prolonged standing, walking, lifting leg, bending Relieving factors: ice, elevating leg   PRECAUTIONS: Knee and Fall  RED FLAGS: None   WEIGHT BEARING RESTRICTIONS: No  FALLS:  Has patient fallen in last 6 months? No  LIVING ENVIRONMENT: Lives with: lives alone Lives in: House/apartment Stairs: Yes: External: 1 steps; no railing Has following equipment at home: Walker - 2 wheeled  OCCUPATION: n/a   PLOF: Independent  PATIENT GOALS: Patient would like to return to normal activities without pain.   NEXT MD VISIT: unsure, I'm supposed to follow up in 2 weeks.'   OBJECTIVE:  Note: Objective measures were completed at Evaluation unless otherwise noted.  DIAGNOSTIC FINDINGS:   no relevant imaging results available in epic; none included in referral    PATIENT SURVEYS:  FOTO 52 current, 63 predicted  COGNITION: Overall cognitive status: Within functional limits for tasks assessed     SENSATION: Not tested  EDEMA:  Circumferential: to be assessed at f/u visits  PALPATION:  Presence of edema around joint replacement   OTHER OBSERVATION: Scar appears to be healing well.   LOWER EXTREMITY ROM:  Active ROM Right  eval Left eval Left 09/15/23  Hip flexion     Hip extension     Hip abduction     Hip adduction     Hip internal rotation     Hip external rotation     Knee flexion  95 109  Knee extension  -6   Ankle dorsiflexion     Ankle plantarflexion     Ankle inversion     Ankle eversion      (Blank rows = not tested)  LOWER EXTREMITY MMT: not formally assessed at evaluation   MMT Right eval Left eval  Hip flexion    Hip extension     Hip abduction    Hip adduction    Hip internal rotation    Hip external rotation    Knee flexion  2-  Knee extension  2-  Ankle dorsiflexion    Ankle plantarflexion    Ankle inversion    Ankle eversion     (Blank rows = not tested)  FUNCTIONAL TESTS:  None performed at evaluation   GAIT: Distance walked: 25 ft Assistive device utilized: Environmental Consultant - 2 wheeled Level of assistance: Modified independence Comments: intermittent festinating gait, small step length, decreased heel strike, decreased knee flexion during swing phase     TODAY'S TREATMENT:    [facility] Adult PT Treatment:                                                DATE: 10/03/2023  Therapeutic Exercise: (supine exercises performed on reclined on table due to dizziness) Nustep x 7 minutes level 6 Sit to stand, 3 x 10  Supine SAQ, 3 x10, 5 sec - 2.5# Supine SLR, 3 x 10, 5 sec Forward Step Ups 2x10 BIL, 4 step Lateral Step Ups 2x10 onto BIL, 4 step  Standing hip abduction x 10 each LE Standing hip extension x 10 each LE  Bil heel raise - 2x10 Slant board stretch 45'' x2 Seated lumbar flexion with pball to reduce back pain between standing exercises  [facility] Adult PT Treatment:                                                DATE: 09/30/2023  Therapeutic Exercise: (supine exercises performed on reclined on table due to dizziness) Nustep x 7 minutes level 3-4 Sit to stand, 3 x 10  Supine SAQ, 3 x10, 5 sec   Supine SLR, 3 x 10, 5 sec Forward Step Ups x 10 BIL, 4 step Lateral Step Ups x 10 onto BIL, 4 step  Standing hip abduction x 10 each LE Standing hip extension x 10 each LE    [facility] Adult PT Treatment:                                                DATE: 09/27/2023  Therapeutic Exercise: (supine exercises performed on reclined on table due to dizziness) Nustep x 7 minutes level 4 Seated Heel Slide 2 x 10 Supine SAQ, 2 x 15, 5 sec   Forward Step Ups x 15 onto Left, 4 step Lateral Step Ups  x 15 onto Left, 4 step   Sit to stand, 2 x 10  Updated HEP  Patient education regarding post-op progress and expectations, including ongoing pain and resting with knee extension  Requires cueing for appropriate pacing.   [facility] Adult PT Treatment:                                                DATE: 09/15/23 Therapeutic Exercise: (supine exercises performed on reclined on table due to dizziness) Nustep x 5 minutes level 3  Supine SAQ, 3 x 10  Seated Heel Slide 2 x 10 SLR with QS Lt 2x10 Supine Quad Set 5 sec 2 x 10 Supine heel slide with strap x10  Therapeutic Activity: 218 ft  Requires cueing for appropriate pacing.        PATIENT EDUCATION:  Education details: reviewed initial home exercise program; discussion of POC, prognosis and goals for skilled PT; post op prognosis, precautions, expectations, restrictions; swelling management strategies, activity pacing   Person educated: Patient Education method: Explanation Education comprehension: verbalized understanding  HOME EXERCISE PROGRAM: Access Code: [email or web address] URL: [email or web address] Date: 09/04/2023 Prepared by: Marko Molt  Exercises - Supine Heel Slide with Strap  - 3 x daily - 7 x weekly - 2 sets - 10 reps - 3 sec hold - Supine Quad Set  - 3 x daily - 7 x weekly - 2 sets - 10 reps - 3 sec hold - Seated Heel Slide  - 3 x daily - 7 x weekly - 2 sets - 10 reps - 3 sec hold - Seated Ankle Pumps on Table  - 3 x daily - 7 x weekly - 2 sets - 10 reps  Patient Education - Ice - TKR Precautions Handout - Pain Management  ASSESSMENT:  CLINICAL IMPRESSION: August tolerated session well with no adverse reaction.  L knee ROM is excellent given time since surgery.  Worked on specific quad strengthening for L knee as well as general bil LE/hip strengthening to improve safety in standing and ambulation.  CGA provided during standing exercises for safety.  OBJECTIVE IMPAIRMENTS: Abnormal gait, decreased activity tolerance,  decreased balance, decreased endurance, decreased mobility, difficulty walking, decreased ROM, decreased strength, decreased safety awareness, and pain.   ACTIVITY LIMITATIONS: carrying, lifting, sitting, standing, squatting, stairs, transfers, bed mobility, and locomotion level  PARTICIPATION LIMITATIONS: meal prep, cleaning, laundry, driving, shopping, community activity, occupation, and yard work  PERSONAL FACTORS: Age, Behavior pattern, Past/current experiences, Time since onset of injury/illness/exacerbation, and 1-2 comorbidities: PMHx includes arthritis, elevated cholesterol, anemia, degenerative arthritis of L knee, neuropathy of peroneal nerve at right knee  are also affecting patient's functional outcome.   REHAB POTENTIAL: Fair    CLINICAL DECISION MAKING: Stable/uncomplicated  EVALUATION COMPLEXITY: Low   GOALS: Goals reviewed with patient? Yes  SHORT TERM GOALS: Target date: 10/02/2023  Patient will be independent with initial home program for knee AROM.  Baseline: provided at eval Goal Status: MET 09/30/23  2.  Patient will demonstrate at least 110 knee flexion AROM and 0 degrees knee extension AROM Baseline: see objective measures Goal status: INITIAL  3.  Patient will be able to verbalize adherence to at least 2 strategies for edema management and 2 strategies for minimizing fall risk. Baseline: demonstrating lack of awareness Goal status: INITIAL  4.  Patient will demonstrate ability to perform supine-to-sit with at  least Mod I.  Baseline: requires min A  Goal status: MET 09/30/23   LONG TERM GOALS: Target date: 10/30/2023   Patient will report improved overall functional ability with FOTO score of 62 or greater.  Baseline: 52 Goal status: INITIAL  2.  Patient will demonstrate ability to safely ambulate at least 160ft in 2 MWT.  Baseline: see objective measures  Goal status: INITIAL  3.  Patient will demonstrate at least 4/5 MMT LE strength BIL.  Baseline:  see objective measures Goal status: INITIAL  4.  Patient will demonstrate ability to safely navigate at least 6 step 10 times without exacerbation of symptoms.  Baseline:  Goal status: INITIAL  5.  Patient will demonstrate at least 120 knee flexion AROM.  Baseline: see objective measures Goal status: INITIAL    PLAN:  PT FREQUENCY: 2x/week  PT DURATION: 8 weeks  PLANNED INTERVENTIONS: 97164- PT Re-evaluation, 97110-Therapeutic exercises, 97530- Therapeutic activity, 97112- Neuromuscular re-education, 97535- Self Care, 02859- Manual therapy, [phone]- Gait training, Patient/Family education, Balance training, Stair training, and Cryotherapy  PLAN FOR NEXT SESSION: continue with focus on knee AROM, patient education regarding post-op precautions/considerations/expectations, begin strengthening as appropriate for post-op status, monitor swelling each visit; functional tests (STS, 2 or 6 MWT)     Helene BRAVO Hilda Rynders PT 10/03/2023 10:48 AM   For all possible CPT codes, reference the Planned Interventions line above.     Check all conditions that are expected to impact treatment: {Conditions expected to impact treatment:Musculoskeletal disorders and Neurological condition and/or seizures   If treatment provided at initial evaluation, no treatment charged due to lack of authorization.

## 2023-10-07 ENCOUNTER — Ambulatory Visit: Payer: Medicaid Other

## 2023-10-09 ENCOUNTER — Ambulatory Visit: Payer: Medicaid Other

## 2023-10-09 DIAGNOSIS — G8929 Other chronic pain: Secondary | ICD-10-CM

## 2023-10-09 DIAGNOSIS — R2689 Other abnormalities of gait and mobility: Secondary | ICD-10-CM

## 2023-10-09 DIAGNOSIS — M6281 Muscle weakness (generalized): Secondary | ICD-10-CM

## 2023-10-09 DIAGNOSIS — R600 Localized edema: Secondary | ICD-10-CM

## 2023-10-09 DIAGNOSIS — M25562 Pain in left knee: Secondary | ICD-10-CM | POA: Diagnosis not present

## 2023-10-09 NOTE — Therapy (Addendum)
 PHYSICAL THERAPY DISCHARGE SUMMARY  Visits from Start of Care: 7  Patient is being discharged due to not returning since last visit (>60 days). She was unable to schedule further appointments at last visit d/t changing insurance.   Arlester Bence, PT, DPT  01/28/2024 2:36 PM    OUTPATIENT PHYSICAL THERAPY NOTE   Patient Name: Shawna Suarez MRN: [id number] DOB:1961/07/13, 63 y.o., female Today's Date: 10/09/2023  END OF SESSION:  PT End of Session - 10/09/23 0947     Visit Number 7    Number of Visits 17    Date for PT Re-Evaluation 10/30/23    Authorization Type [employer]    Authorization Time Period 13 visits approved 09/08/23-12/07/23    Authorization - Visit Number 6    Authorization - Number of Visits 13    PT Start Time 1000    PT Stop Time 1040    PT Time Calculation (min) 40 min    Activity Tolerance Patient tolerated treatment well;Patient limited by pain    Behavior During Therapy [facility] for tasks assessed/performed             Past Medical History:  Diagnosis Date   Arthritis    Heart murmur    Neuromuscular disorder (HCC)    Past Surgical History:  Procedure Laterality Date   right breast biopsy  2002   TOTAL KNEE ARTHROPLASTY Left 08/18/2023   Procedure: LEFT TOTAL KNEE ARTHROPLASTY;  Surgeon: Wendolyn Hamburger, MD;  Location: [facility];  Service: Orthopedics;  Laterality: Left;   Patient Active Problem List   Diagnosis Date Noted   Elevated cholesterol 08/17/2023   History of anemia 08/17/2023   Lumbago with sciatica, left side 08/17/2023   Degenerative arthritis of left knee 08/13/2023   Encounter for screening for other metabolic disorders 12/25/2022   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   PERTINENT HISTORY: PMHx includes arthritis, elevated cholesterol, anemia, degenerative arthritis of L knee, neuropathy of peroneal nerve at right knee, neuromuscular disorder; TAKING ANTI-COAGULANTS   PAIN:  Are you having pain?  Yes: NPRS scale: 6.5/10  Pain location: Left Knee Pain description: "pain"  Aggravating factors: prolonged standing, walking, lifting leg, bending Relieving factors: ice, elevating leg   PRECAUTIONS: Knee and Fall  RED FLAGS: None   WEIGHT BEARING RESTRICTIONS: No  FALLS:  Has patient fallen in last 6 months? No  LIVING ENVIRONMENT: Lives with: lives alone Lives in: House/apartment Stairs: Yes: External: 1 steps; no railing Has following equipment at home: Walker - 2 wheeled  OCCUPATION: n/a   PLOF: Independent  PATIENT GOALS: Patient would like to return to normal activities without pain.   NEXT MD VISIT: unsure, "I'm supposed to follow up in 2 weeks.'   OBJECTIVE:  Note: Objective measures were completed at Evaluation unless otherwise noted.  DIAGNOSTIC FINDINGS:   no relevant imaging results available in epic; none included in referral    PATIENT  SURVEYS:  FOTO 52 current, 63 predicted 10/09/23: 64%  COGNITION: Overall cognitive status: Within functional limits for tasks assessed     SENSATION: Not tested  EDEMA:  Circumferential: to be assessed at f/u visits  PALPATION:  Presence of edema around joint replacement   OTHER OBSERVATION: Scar appears to be healing well.   LOWER EXTREMITY ROM:  Active ROM Right eval Left eval Left 09/15/23 Left 10/09/23  Hip flexion      Hip extension       Hip abduction      Hip adduction      Hip internal rotation      Hip external rotation      Knee flexion  95 109 112  Knee extension  -6    Ankle dorsiflexion      Ankle plantarflexion      Ankle inversion      Ankle eversion       (Blank rows = not tested)  LOWER EXTREMITY MMT: not formally assessed at evaluation   MMT Right eval Left eval Left 10/09/23  Hip flexion     Hip extension     Hip abduction     Hip adduction     Hip internal rotation     Hip external rotation     Knee flexion  2- 4  Knee extension  2- 4p!  Ankle dorsiflexion     Ankle plantarflexion     Ankle inversion     Ankle eversion      (Blank rows = not tested)  FUNCTIONAL TESTS:  None performed at evaluation  10/09/23: 254 ft  GAIT: Distance walked: 25 ft Assistive device utilized: Environmental consultant - 2 wheeled Level of assistance: Modified independence Comments: intermittent festinating gait, small step length, decreased heel strike, decreased knee flexion during swing phase     TODAY'S TREATMENT:   [facility] Adult PT Treatment:                                                DATE: 10/09/23 Therapeutic Exercise: Nustep x 6 minutes level 6 Therapeutic Activity: 254 ft Discussion and review of goals Update and review of HEP, self care for knee pain such as use of ice, Biofreeze, etc.   [facility] Adult PT Treatment:                                                DATE: 10/03/2023  Therapeutic Exercise: (supine exercises performed on reclined on table due to dizziness) Nustep x 7 minutes level 6 Sit to stand, 3 x 10  Supine SAQ, 3 x10, 5 sec - 2.5# Supine SLR, 3 x 10, 5 sec Forward Step Ups 2x10 BIL, 4" step Lateral Step Ups 2x10 onto BIL, 4" step  Standing hip abduction x 10 each LE Standing hip extension x 10 each LE  Bil heel raise - 2x10 Slant board stretch 45'' x2 Seated lumbar flexion with pball to reduce back pain between standing exercises  [facility] Adult PT Treatment:  DATE: 09/30/2023  Therapeutic Exercise: (supine exercises performed on reclined on table due to dizziness) Nustep x 7 minutes level 3-4 Sit to stand, 3 x 10  Supine SAQ, 3 x10, 5 sec   Supine SLR, 3 x 10, 5 sec Forward Step Ups x 10 BIL, 4" step Lateral Step Ups x 10 onto BIL, 4" step  Standing hip abduction x 10 each LE Standing hip extension x 10 each LE     PATIENT EDUCATION:  Education details: reviewed initial home exercise program; discussion of POC, prognosis and goals for skilled PT; post op prognosis, precautions, expectations, restrictions; swelling management strategies, activity pacing   Person educated: Patient Education method: Explanation Education comprehension: verbalized understanding  HOME EXERCISE PROGRAM: Access Code: [email or web address] URL: [email or web address] Date: 10/09/2023 Prepared by: Anna Kettering  Exercises - Standing Hip Abduction with Counter Support  - 1 x daily - 7 x weekly - 2 sets - 10 reps - Standing Hip Extension with Counter Support  - 1 x daily - 7 x weekly - 2 sets - 10 reps - Supine Active Straight Leg Raise  - 1 x daily - 7 x weekly - 2 sets - 10 reps - Heel Raises with Counter Support  - 1 x daily - 7 x weekly - 2 sets - 10 reps - Mini Squat with Counter Support  - 1 x daily - 7 x weekly - 2 sets - 10 reps - Supine Bridge  - 1 x daily - 7 x weekly - 3 sets - 10 reps - Clamshell  - 1 x daily - 7 x weekly - 3 sets - 10 reps  Patient Education - Ice - TKR Precautions Handout - Pain Management  ASSESSMENT:  CLINICAL IMPRESSION: Patient presents to PT reporting that her insurance is getting cancelled and she won't be able to continue PT at this time. Session today continued to focus on knee and proximal hip strengthening with update of HEP so that she can continue working on her PT at home until she is able to obtain new insurance. She has met or partially met all her goals, demonstrating improved ROM,  strength, and functional mobility. We will pause PT at this time and resume when she is able to obtain new insurance.     OBJECTIVE IMPAIRMENTS: Abnormal gait, decreased activity tolerance, decreased balance, decreased endurance, decreased mobility, difficulty walking, decreased ROM, decreased strength, decreased safety awareness, and pain.   ACTIVITY LIMITATIONS: carrying, lifting, sitting, standing, squatting, stairs, transfers, bed mobility, and locomotion level  PARTICIPATION LIMITATIONS: meal prep, cleaning, laundry, driving, shopping, community activity, occupation, and yard work  PERSONAL FACTORS: Age, Behavior pattern, Past/current experiences, Time since onset of injury/illness/exacerbation, and 1-2 comorbidities: PMHx includes arthritis, elevated cholesterol, anemia, degenerative arthritis of L knee, neuropathy of peroneal nerve at right knee  are also affecting patient's functional outcome.   REHAB POTENTIAL: Fair    CLINICAL DECISION MAKING: Stable/uncomplicated  EVALUATION COMPLEXITY: Low   GOALS: Goals reviewed with patient? Yes  SHORT TERM GOALS: Target date: 10/02/2023  Patient will be independent with initial home program for knee AROM.  Baseline: provided at eval Goal Status: MET 09/30/23  2.  Patient will demonstrate at least 110 knee flexion AROM and 0 degrees knee extension AROM Baseline: see objective measures Goal status: MET 1/09/29/23: 112  3.  Patient will be able to verbalize adherence to at least 2 strategies for edema management and 2 strategies for minimizing fall risk. Baseline: demonstrating lack of awareness Goal status: MET 10/09/23:  Pt able to verbalize  4.  Patient will demonstrate ability to perform supine-to-sit with at least Mod I.  Baseline: requires min A  Goal status: MET 09/30/23   LONG TERM GOALS: Target date: 10/30/2023   Patient will report improved overall functional ability with FOTO score of 62 or greater.  Baseline: 52 Goal  status: MET 10/09/23: 64%  2.  Patient will demonstrate ability to safely ambulate at least 175ft in 2 MWT.  Baseline: see objective measures  Goal status: MET 10/09/23: 254 ft  3.  Patient will demonstrate at least 4/5 MMT LE strength BIL.  Baseline: see objective measures Goal status: MET See above chart  4.  Patient will demonstrate ability to safely navigate at least 6" step 10 times without exacerbation of symptoms.  Baseline:  Goal status: MET 10/09/23: Pt able to perform safely without sym  5.  Patient will demonstrate at least 120 knee flexion AROM.  Baseline: see objective measures Goal status: Partially met See above measures   PLAN:  PT FREQUENCY: 2x/week  PT DURATION: 8 weeks  PLANNED INTERVENTIONS: 97164- PT Re-evaluation, 97110-Therapeutic exercises, 97530- Therapeutic activity, 97112- Neuromuscular re-education, 97535- Self Care, 40981- Manual therapy, [phone]- Gait training, Patient/Family education, Balance training, Stair training, and Cryotherapy  PLAN FOR NEXT SESSION: continue with focus on knee AROM, patient education regarding post-op precautions/considerations/expectations, begin strengthening as appropriate for post-op status, monitor swelling each visit; functional tests (STS, 2 or 6 MWT)     Anna Kettering PTA 10/09/2023 10:47 AM   For all possible CPT codes, reference the Planned Interventions line above.     Check all conditions that are expected to impact treatment: {Conditions expected to impact treatment:Musculoskeletal disorders and Neurological condition and/or seizures   If treatment provided at initial evaluation, no treatment charged due to lack of authorization.

## 2023-11-11 ENCOUNTER — Other Ambulatory Visit: Payer: Self-pay | Admitting: Neurology

## 2023-11-13 ENCOUNTER — Other Ambulatory Visit: Payer: Self-pay

## 2023-12-24 ENCOUNTER — Ambulatory Visit: Payer: Medicaid Other | Admitting: Neurology

## 2023-12-31 ENCOUNTER — Ambulatory Visit: Payer: Medicaid Other | Admitting: Neurology

## 2024-04-29 ENCOUNTER — Other Ambulatory Visit: Payer: Self-pay | Admitting: Adult Health

## 2024-05-06 ENCOUNTER — Telehealth: Payer: Self-pay | Admitting: Adult Health

## 2024-05-06 NOTE — Telephone Encounter (Signed)
 Pt states she has made the office aware that her Medicare does not kick in until the month of October, she is unable to come in before then.  Pt is asking if she can get a Rx for gabapentin  (NEURONTIN ) 100 MG capsule  for 2 months and then will be able to come in for needed 1 yr f.u, please call.

## 2024-05-07 NOTE — Telephone Encounter (Signed)
 Pt has called to f/u on request from yesterday.  The response from CMA was relayed to pt.  She stated she will not have insurance until Oct.  The pharmacy she is using is  [facility] [phone]

## 2024-05-08 ENCOUNTER — Other Ambulatory Visit: Payer: Self-pay | Admitting: Adult Health

## 2024-05-10 NOTE — Telephone Encounter (Signed)
 Noted

## 2024-05-10 NOTE — Telephone Encounter (Signed)
 Called pt and offered held appt. Pt accepted and has been scheduled.

## 2024-06-13 ENCOUNTER — Other Ambulatory Visit: Payer: Self-pay | Admitting: Adult Health

## 2024-06-29 ENCOUNTER — Telehealth: Payer: Self-pay | Admitting: Neurology

## 2024-06-29 ENCOUNTER — Ambulatory Visit: Admitting: Neurology

## 2024-06-29 ENCOUNTER — Encounter: Payer: Self-pay | Admitting: Neurology

## 2024-06-29 VITALS — BP 128/82 | HR 76 | Ht 63.0 in | Wt 187.0 lb

## 2024-06-29 DIAGNOSIS — G5731 Lesion of lateral popliteal nerve, right lower limb: Secondary | ICD-10-CM

## 2024-06-29 DIAGNOSIS — R0683 Snoring: Secondary | ICD-10-CM

## 2024-06-29 DIAGNOSIS — R269 Unspecified abnormalities of gait and mobility: Secondary | ICD-10-CM | POA: Diagnosis not present

## 2024-06-29 DIAGNOSIS — M5412 Radiculopathy, cervical region: Secondary | ICD-10-CM

## 2024-06-29 DIAGNOSIS — M21371 Foot drop, right foot: Secondary | ICD-10-CM

## 2024-06-29 DIAGNOSIS — M21611 Bunion of right foot: Secondary | ICD-10-CM | POA: Diagnosis not present

## 2024-06-29 NOTE — Telephone Encounter (Signed)
 no auth required sent to GI [phone]

## 2024-06-29 NOTE — Patient Instructions (Signed)
 Cervical spine MRI to rule out myelopathy due to right-sided weakness, bradykinesia, rigidity Referral to podiatry for management of new bunion causing right foot pain Referral to sleep neurology to rule out sleep apnea Please call me if symptoms do get worse Follow-up in a year or sooner if worse

## 2024-06-29 NOTE — Progress Notes (Signed)
 Patient   [facility]  PATIENT: Shawna Suarez DOB: Feb 15, 1961  REQUESTING CLINICIAN: Cleotilde, [place]  E, PA HISTORY FROM: Patient  REASON FOR VISIT: Cervical pain, peroneal nerve entrapment   HISTORICAL  CHIEF COMPLAINT:  Chief Complaint  Patient presents with   New Patient (Initial Visit)    Rm 13, with daughter,     INTERVAL HISTORY 06/29/2024 Shawna Suarez presents today for follow-up, she is accompanied by her daughter.  Last visit was in July 2024.  At that time she had completed a right peroneal nerve release surgery, reports some mild improvement of the right foot drop but still using a cane with ambulation.  She was lost to follow-up due to lack of insurance.   Today she presents with her daughter, tells me that her situation is the same, and has not gotten better.  She has completed multiple rounds of physical therapy, she does toe walk which close her pain in the right foot.  She also reports development of a new bunion in the right foot.  She has been falling also.   Reports weakness in the right side of her body.  Has difficulty lifting things with her right hand, difficulty with fine movement and coordination with the right hand.  She tells me that her right foot keeps getting caught when walking.  She does have a right ankle-foot braces but tells me the braces causes pain.   Daughter is also reporting loud snoring, gasping for air or choking during sleep.  She denies any morning headache but daughter reports patient falling asleep easily when watching TV or sometimes during conversation.  She had gained some weight since last visit a year ago.   Interval history 03/31/2023 JM:  Since prior visit, she was seen by neurosurgery and underwent right peroneal nerve release at the fibular head on 03/11/2023. Noted gradual improvement of right food drop.  Continues to follow with neurosurgery.  Does note continued BLE numbness especially at night which is essentially  unchanged from prior visit.  Continues to ambulate with rolling walker.  Denies any recent falls.  She also notes chronic cervical pain which radiates down her right arm, reports numbness in her pointer and middle finger and weakness in right arm. Also notes limited right shoulder ROM.  Will experience tension type headaches with increased pain.  She also continues to have low back pain which is also chronic.  She has not yet had any cervical imaging due to prior issues with insurance coverage.  Did complete CT head 12/2021 for concern of headaches which was unremarkable.  Currently using gabapentin  100mg  AM and 200mg  PM and Robaxin  750mg .  Tolerating medication well but continues to experience nerve pain and questions increased dose of gabapentin .    History provided from Dr. Janean prior OV note for reference purposes only INTERVAL HISTORY 12/25/2022 Shawna Suarez presents today for follow-up, last visit was in October.  Since then she reports her situation is the same.  She was unfortunately unable to get full coverage insurance, therefore could not go to physical therapy and could not get her ankle brace.  She does use a walker with ambulation but still has fall.  Denies any worsening of the pain or weakness but with right foot drop, she still has she still have gait instability.  She was told that she will be approved for full Medicare 2025..   INTERVAL HISTORY 07/09/2022 Shawna Suarez presented for follow-up, she continues to have issues with ambulation, uses walker.  Reported she continue  to do exercise at home and there is mild improvement of the right foot.  She has upcoming disability evaluation at the end of the month.  Lately for her she was able to get Medicare. Continue to report falls but no injuries.   INTERVAL HISTORY 04/08/22: Patient presents today for follow-up, last visit plan was to restart physical therapy, she reports that she defer PT because it will cost her more than $4000 out to pocket  therefore has not completed.  She denies any fall since last visit.  She is doing the physical therapy work exercise at home and uses a cane.  She still trying to work with her employer to obtain disability. She reports that Gabapentin  and Robaxin  has helped and needs referral.    INTERVAL HISTORY 02/12/22:  Patient presents today for follow-up, she is accompanied by her daughter's mother-in-law Shawna Suarez.  She reported that she completed physical therapy but it made her back pain worse, now she is having spasm in the back all the way to the neck.  She said her gait is worse due to her right foot drop now the left leg is involved now.  She does use a walker but had fallen 5 times within the past month.  Currently she is on leave at work because she cannot perform her duties due to gait instability.  Right lower extremity still weak.   She reports 1 of the doctor at [facility] has recommended a brace but physical therapist advised against it.  At that time she was still getting physical therapy so I deferred the decision to use of brace to physical therapy.  She still not using a brace in the right lower leg. She is still complaining of bilateral foot numbness which is making her gait worse.    HISTORY OF PRESENT ILLNESS:  This is a 63 year old woman with no reported past medical history who is presenting with complaint of right lower extremity pain, described as shooting pain, and right foot weakness.  Patient said the symptoms started since last September after receiving the second dose of COVID.  Since that she has been having intermittent pain.  She followed with orthopedist, had a lumbar MRI which showed no abnormality other than degenerative disc disease.  She is able to ambulate but stated she has to be very careful, denies any falls.  She reported the pain is located right at the hip joint and radiates behind right leg all the way down to the foot, there is also report of numbness on the bottom of  the foot.  Denies back pain.    OTHER MEDICAL CONDITIONS: None    REVIEW OF SYSTEMS: Full 14 system review of systems performed and negative with exception of: noted in the HPI   ALLERGIES: Allergies  Allergen Reactions   Penicillin G Anaphylaxis   Aspirin Other (See Comments)    syncope   Codeine Other (See Comments)    unknown   Prednisone Other (See Comments)    headaches   Sulfa Antibiotics Hives    HOME MEDICATIONS: Outpatient Medications Prior to Visit  Medication Sig Dispense Refill   acetaminophen  (TYLENOL ) 650 MG CR tablet Take 650-1,300 mg by mouth every 8 (eight) hours as needed for pain.     atorvastatin (LIPITOR) 10 MG tablet Take 10 mg by mouth 3 (three) times a week. Tuesday, Thursdays & Saturdays.     B Complex-C (B-COMPLEX WITH VITAMIN C) tablet Take 1 tablet by mouth in the morning.  Cholecalciferol (VITAMIN D3) 50 MCG (2000 UT) TABS Take 2,000 Units by mouth in the morning. I'm not taking it with the blood thinners.     fluticasone (FLONASE) 50 MCG/ACT nasal spray Place 1 spray into both nostrils at bedtime as needed for allergies (nasal congestion.).     gabapentin  (NEURONTIN ) 300 MG capsule TAKE 1 CAPSULE BY MOUTH TWICE A DAY 60 capsule 5   meloxicam (MOBIC) 15 MG tablet Take 15 mg by mouth daily.     methocarbamol  (ROBAXIN ) 500 MG tablet TAKE 1.5 TABLETS (750 MG TOTAL) BY MOUTH 2 (TWO) TIMES DAILY. 90 tablet 0   Multiple Vitamin (MULTIVITAMIN WITH MINERALS) TABS tablet Take 1 tablet by mouth in the morning.     ondansetron  (ZOFRAN ) 8 MG tablet Take 8 mg by mouth daily as needed for nausea or vomiting.     apixaban  (ELIQUIS ) 2.5 MG TABS tablet Take 1 tablet (2.5 mg total) by mouth 2 (two) times daily. (Patient not taking: Reported on 06/29/2024) 30 tablet 0   No facility-administered medications prior to visit.    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   Procedure: LEFT TOTAL KNEE ARTHROPLASTY;  Surgeon: Liam Lerner, MD;  Location: [facility];  Service: Orthopedics;  Laterality: Left;    FAMILY HISTORY: Family History  Problem Relation Age of Onset   Lung cancer Mother    Cervical cancer Mother    Throat cancer Father    Prostate cancer Father     SOCIAL HISTORY: Social History   Socioeconomic History   Marital status: Divorced    Spouse name: Not on file   Number of children: Not on file   Years of education: Not on file   Highest education level: Not on file  Occupational History   Not on file  Tobacco Use   Smoking status: Former    Current packs/day: 0.00    Types: Cigarettes    Start date: 67    Quit date: 1978    Years since quitting: 47.7   Smokeless tobacco: Never  Substance and Sexual Activity   Alcohol use: Never   Drug use: Never   Sexual activity: Not on file  Other Topics Concern   Not on file  Social History Narrative   Left handed   Caffeine-none   Doesn't work   Social Drivers of Corporate investment banker Strain: Not on file  Food Insecurity: Not on file  Transportation Needs: Not on file  Physical Activity: Not on file  Stress: Not on file  Social Connections: Not on file  Intimate Partner Violence: Not on file    PHYSICAL EXAM  GENERAL EXAM/CONSTITUTIONAL: Vitals:  Vitals:   06/29/24 0910  BP: 128/82  Pulse: 76  SpO2: 95%  Weight: 187 lb (84.8 kg)  Height: 5' 3 (1.6 m)    Body mass index is 33.13 kg/m. Wt Readings from Last 3 Encounters:  06/29/24 187 lb (84.8 kg)  08/18/23 170 lb (77.1 kg)  08/07/23 170 lb (77.1 kg)   Patient is in no distress; well developed, nourished and groomed; neck is supple  MUSCULOSKELETAL: Gait, strength, tone, movements noted in Neurologic exam below  NEUROLOGIC: MENTAL STATUS:      No data to  display  awake, alert, oriented to person, place and time recent and remote memory intact normal attention and concentration language fluent, comprehension intact, naming intact fund of knowledge appropriate  CRANIAL NERVE:  2nd, 3rd, 4th, 6th - visual fields full to confrontation, extraocular muscles intact, no nystagmus 5th - facial sensation symmetric 7th - facial strength symmetric 8th - hearing intact 9th - palate elevates symmetrically, uvula midline 11th - shoulder shrug symmetric 12th - tongue protrusion midline  MOTOR:  Full strength in left upper and lower extremity.  Decrease strength about 4/5 for right foot dorsiflexion and inversion when compared to the left.  Decreased strength in RUE compared to LUE, decreased hand grip in right compared to left. There is also increase rigidity and bradykinesia in the RUE.   SENSORY:  Decrease sensation to vibration and pinprick up to ankle bilateral lower extremities   COORDINATION:  finger-nose-finger, fine finger movements normal  REFLEXES:  1+ right brachioradialis, 2+ on the left   GAIT/STATION:  Wide based, high steppage gait. walk on her the tip of right foot. Gait appears to be somewhat magnetic. Uses a walker    DIAGNOSTIC DATA (LABS, IMAGING, TESTING) - I reviewed patient records, labs, notes, testing and imaging myself where available.  Lab Results  Component Value Date   WBC 6.8 08/07/2023   HGB 13.1 08/07/2023   HCT 41.5 08/07/2023   MCV 93.5 08/07/2023   PLT 278 08/07/2023      Component Value Date/Time   NA 140 02/13/2022 1542   K 4.1 02/13/2022 1542   CL 100 02/13/2022 1542   CO2 24 02/13/2022 1542   GLUCOSE 94 02/13/2022 1542   GLUCOSE 123 (H) 01/20/2022 1926   BUN 8 02/13/2022 1542   CREATININE 0.56 (L) 02/13/2022 1542   CALCIUM 9.6 02/13/2022 1542   PROT 7.1 02/13/2022 1542   ALBUMIN 5.2 (H) 02/13/2022 1542   AST 18 02/13/2022 1542   ALT 20 02/13/2022 1542   ALKPHOS 88 02/13/2022  1542   BILITOT <0.2 02/13/2022 1542   GFRNONAA >60 01/20/2022 1823   No results found for: CHOL, HDL, LDLCALC, LDLDIRECT, TRIG, CHOLHDL Lab Results  Component Value Date   HGBA1C 5.9 (H) 02/13/2022   Lab Results  Component Value Date   VITAMINB12 1,305 (H) 02/13/2022   Lab Results  Component Value Date   TSH 2.240 02/13/2022   Head CT 01/20/22 No acute intracranial findings are seen in the noncontrast CT brain. Atrophy.  MRI Lumbar spine 05/21/21 1.  Mild multilevel lumbar spondylosis with mild to moderate right-sided foraminal stenosis at L3-L4 and L4-L5. 2.  Mild bilateral subarticular recess stenosis at L2-L3, L3 on 4 and L4-5.  No high-grade canal stenosis.   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   ASSESSMENT AND PLAN  63 y.o. year old female with no reported past medical history who is presenting for follow up right foot drop and right foot numbness 2/2 peroneal neuropathy as evidenced on EMG/NCV s/p peroneal nerve release 03/11/2023.  She continued to have a high steppage gait, working on her toes and has a new bunion.  I will refer her to podiatry for management of the bunion.  I also have advised her to continue using a cane walker with ambulation.  With her new complaint of snoring loud snoring, choking during her sleep, falling asleep easily, I will refer her to sleep neurology to rule out sleep apnea. We will repeat the cervical spine MRI due to worsening  right-sided weakness, rigidity, bradykinesia and decrease reflexes on the right.  Advised daughter to monitor her symptoms and contact me if they do get worse.  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 Cervical radiculopathy   3. Right foot drop   4. Gait abnormality   5. Bunion of great toe of right foot   6. Snoring     Patient Instructions  Cervical spine MRI  to rule out myelopathy due to right-sided weakness, bradykinesia, rigidity Referral to podiatry for management of new bunion causing right foot pain Referral to sleep neurology to rule out sleep apnea Please call me if symptoms do get worse Follow-up in a year or sooner if worse    Orders Placed This Encounter  Procedures   MR CERVICAL SPINE WO CONTRAST   Ambulatory referral to Podiatry   Ambulatory referral to Neurology    No orders of the defined types were placed in this encounter.       Pastor Falling, MD [facility] [address] [place], [place] [address] Phone [phone] Fax [phone]

## 2024-06-30 ENCOUNTER — Telehealth: Payer: Self-pay | Admitting: Neurology

## 2024-06-30 NOTE — Telephone Encounter (Signed)
 Referral for Podiatry faxed to [facility]  [facility] Phone:[phone] Fax:[phone]

## 2024-07-03 ENCOUNTER — Other Ambulatory Visit: Payer: Self-pay | Admitting: Adult Health

## 2024-07-07 NOTE — Telephone Encounter (Signed)
 Last filled by patient on 06/15/24 Last office visit : 06/29/24 Next office visit :  06/27/25

## 2024-07-13 ENCOUNTER — Ambulatory Visit
Admission: RE | Admit: 2024-07-13 | Discharge: 2024-07-13 | Disposition: A | Discharge status: Home / Self Care | Source: Ambulatory Visit | Attending: Neurology | Admitting: Neurology

## 2024-07-13 DIAGNOSIS — M5412 Radiculopathy, cervical region: Secondary | ICD-10-CM | POA: Diagnosis not present

## 2024-07-20 ENCOUNTER — Ambulatory Visit: Payer: Self-pay | Admitting: Neurology

## 2024-07-20 DIAGNOSIS — M5412 Radiculopathy, cervical region: Secondary | ICD-10-CM

## 2024-07-20 DIAGNOSIS — M4802 Spinal stenosis, cervical region: Secondary | ICD-10-CM

## 2024-07-22 DIAGNOSIS — M21371 Foot drop, right foot: Secondary | ICD-10-CM | POA: Diagnosis not present

## 2024-07-22 DIAGNOSIS — M5442 Lumbago with sciatica, left side: Secondary | ICD-10-CM | POA: Diagnosis not present

## 2024-07-22 DIAGNOSIS — Z1231 Encounter for screening mammogram for malignant neoplasm of breast: Secondary | ICD-10-CM | POA: Diagnosis not present

## 2024-07-22 DIAGNOSIS — Z862 Personal history of diseases of the blood and blood-forming organs and certain disorders involving the immune mechanism: Secondary | ICD-10-CM | POA: Diagnosis not present

## 2024-07-22 DIAGNOSIS — G8929 Other chronic pain: Secondary | ICD-10-CM | POA: Diagnosis not present

## 2024-07-22 DIAGNOSIS — M5441 Lumbago with sciatica, right side: Secondary | ICD-10-CM | POA: Diagnosis not present

## 2024-07-22 DIAGNOSIS — R7303 Prediabetes: Secondary | ICD-10-CM | POA: Diagnosis not present

## 2024-07-22 DIAGNOSIS — R269 Unspecified abnormalities of gait and mobility: Secondary | ICD-10-CM | POA: Diagnosis not present

## 2024-07-22 DIAGNOSIS — E7800 Pure hypercholesterolemia, unspecified: Secondary | ICD-10-CM | POA: Diagnosis not present

## 2024-07-22 DIAGNOSIS — R0681 Apnea, not elsewhere classified: Secondary | ICD-10-CM | POA: Diagnosis not present

## 2024-07-22 DIAGNOSIS — M2011 Hallux valgus (acquired), right foot: Secondary | ICD-10-CM | POA: Diagnosis not present

## 2024-07-22 DIAGNOSIS — Z0000 Encounter for general adult medical examination without abnormal findings: Secondary | ICD-10-CM | POA: Diagnosis not present

## 2024-07-27 ENCOUNTER — Telehealth: Payer: Self-pay | Admitting: Neurology

## 2024-07-27 NOTE — Telephone Encounter (Signed)
 Referral t Neurosurgery sent thru epic to [facility] at [facility] Neurosurgery at [facility] [phone]

## 2024-08-06 ENCOUNTER — Ambulatory Visit: Admitting: Podiatry

## 2024-08-06 DIAGNOSIS — M65971 Unspecified synovitis and tenosynovitis, right ankle and foot: Secondary | ICD-10-CM | POA: Diagnosis not present

## 2024-08-06 LAB — COLOGUARD: COLOGUARD: NEGATIVE

## 2024-08-06 NOTE — Progress Notes (Signed)
 Subjective:  Patient ID: Shawna Suarez, female    DOB: 11/04/1960,  MRN: [id number]  Chief Complaint  Patient presents with   Toe Pain    Right foot toe pain and pain on the top of her foot     63 y.o. female presents with the above complaint.  Patient presents with complaint of right second metatarsophalangeal joint pain hurts with ambulation or shoe pressure she states been going for quite some time.  She wanted to get it evaluated pain scale 7 out of 10 dull aching nature hurts with ambulation or shoe pressure.   Review of Systems: Negative except as noted in the HPI. Denies N/V/F/Ch.  Past Medical History:  Diagnosis Date   Arthritis    Heart murmur    Neuromuscular disorder (HCC)     Current Outpatient Medications:    acetaminophen  (TYLENOL ) 650 MG CR tablet, Take 650-1,300 mg by mouth every 8 (eight) hours as needed for pain., Disp: , Rfl:    apixaban  (ELIQUIS ) 2.5 MG TABS tablet, Take 1 tablet (2.5 mg total) by mouth 2 (two) times daily. (Patient not taking: Reported on 06/29/2024), Disp: 30 tablet, Rfl: 0   atorvastatin (LIPITOR) 10 MG tablet, Take 10 mg by mouth 3 (three) times a week. Tuesday, Thursdays & Saturdays., Disp: , Rfl:    B Complex-C (B-COMPLEX WITH VITAMIN C) tablet, Take 1 tablet by mouth in the morning., Disp: , Rfl:    Cholecalciferol (VITAMIN D3) 50 MCG (2000 UT) TABS, Take 2,000 Units by mouth in the morning. I'm not taking it with the blood thinners., Disp: , Rfl:    fluticasone (FLONASE) 50 MCG/ACT nasal spray, Place 1 spray into both nostrils at bedtime as needed for allergies (nasal congestion.)., Disp: , Rfl:    gabapentin  (NEURONTIN ) 300 MG capsule, TAKE 1 CAPSULE BY MOUTH TWICE A DAY, Disp: 60 capsule, Rfl: 5   meloxicam (MOBIC) 15 MG tablet, Take 15 mg by mouth daily., Disp: , Rfl:    methocarbamol  (ROBAXIN ) 500 MG tablet, TAKE 1 & 1/2 TABLETS BY MOUTH UP TO 2 TIMES DAILY., Disp: 90 tablet, Rfl: 3   Multiple Vitamin (MULTIVITAMIN WITH MINERALS)  TABS tablet, Take 1 tablet by mouth in the morning., Disp: , Rfl:    ondansetron  (ZOFRAN ) 8 MG tablet, Take 8 mg by mouth daily as needed for nausea or vomiting., Disp: , Rfl:   Social History   Tobacco Use  Smoking Status Former   Current packs/day: 0.00   Types: Cigarettes   Start date: 36   Quit date: 1978   Years since quitting: 47.9  Smokeless Tobacco Never    Allergies  Allergen Reactions   Penicillin G Anaphylaxis   Aspirin Other (See Comments)    syncope   Codeine Other (See Comments)    unknown   Prednisone Other (See Comments)    headaches   Sulfa Antibiotics Hives   Objective:  There were no vitals filed for this visit. There is no height or weight on file to calculate BMI. Constitutional Well developed. Well nourished.  Vascular Dorsalis pedis pulses palpable bilaterally. Posterior tibial pulses palpable bilaterally. Capillary refill normal to all digits.  No cyanosis or clubbing noted. Pedal hair growth normal.  Neurologic Normal speech. Oriented to person, place, and time. Epicritic sensation to light touch grossly present bilaterally.  Dermatologic Nails well groomed and normal in appearance. No open wounds. No skin lesions.  Orthopedic: Right second metatarsal phalangeal joint pain mild pain with range of motion negative extensor  flexor tendinitis noted.  No deep intra-articular pain noted.  She is   Radiographs: None Assessment:   1. Synovitis of right foot    Plan:  Patient was evaluated and treated and all questions answered.  Right second metatarsophalangeal joint synovitis - All questions or concerns were discussed with the patient in extensive detail - Given the amount of pain that she is experiencing she would benefit from steroid injection to help decrease inflammatory, surgical pain.  Patient agrees with follow-up to proceed with steroid injection -A steroid injection was performed at right second MTP using 1% plain Lidocaine and 10 mg  of Kenalog. This was well tolerated.   No follow-ups on file.

## 2024-08-09 ENCOUNTER — Institutional Professional Consult (permissible substitution): Admitting: Neurology

## 2024-08-17 ENCOUNTER — Other Ambulatory Visit: Payer: Self-pay | Admitting: *Deleted

## 2024-08-17 MED ORDER — METHOCARBAMOL 500 MG PO TABS: ORAL_TABLET | ORAL | 3 refills | Status: DC

## 2024-08-17 MED ORDER — GABAPENTIN 300 MG PO CAPS: 300.0000 mg | ORAL_CAPSULE | Freq: Two times a day (BID) | ORAL | 5 refills | Status: AC

## 2024-08-17 NOTE — Telephone Encounter (Signed)
 Last seen on 06/29/24 Follow up scheduled on 06/27/25

## 2024-08-23 ENCOUNTER — Encounter: Payer: Self-pay | Admitting: Neurology

## 2024-08-23 NOTE — Telephone Encounter (Signed)
 Error

## 2024-08-23 NOTE — Telephone Encounter (Signed)
 Patient asking for Neurosurgery to be sent to [facility] to see Dr. Jetty.  Phone: [phone]. Have seen Dr. Dumonaki before and would like referral sent to their office due so would not have to drive so far. Patient will cancel appointment in [facility] once Dr. Lonita office receive referral.   Can you create another due to previous one was sent through [facility].

## 2024-08-24 ENCOUNTER — Other Ambulatory Visit: Payer: Self-pay

## 2024-08-25 ENCOUNTER — Other Ambulatory Visit: Payer: Self-pay | Admitting: Neurology

## 2024-08-25 DIAGNOSIS — M4802 Spinal stenosis, cervical region: Secondary | ICD-10-CM

## 2024-08-25 DIAGNOSIS — M5412 Radiculopathy, cervical region: Secondary | ICD-10-CM

## 2024-08-25 NOTE — Telephone Encounter (Signed)
 Done. Referral sent. Thanks

## 2024-09-03 ENCOUNTER — Ambulatory Visit: Admitting: Podiatry

## 2024-09-03 DIAGNOSIS — M65971 Unspecified synovitis and tenosynovitis, right ankle and foot: Secondary | ICD-10-CM | POA: Diagnosis not present

## 2024-09-03 NOTE — Progress Notes (Signed)
 Subjective:  Patient ID: Shawna Suarez, female    DOB: 1961/01/06,  MRN: [id number]  Chief Complaint  Patient presents with   <9>Synovitis of right foot    Pt stated that she is still having pain     63 y.o. female presents with the above complaint.  Patient presents with follow-up of right second metatarsophalangeal joint pain.  She states the injection did not help she would like to discuss next treatment plan denies any other acute complaints   Review of Systems: Negative except as noted in the HPI. Denies N/V/F/Ch.  Past Medical History:  Diagnosis Date   Arthritis    Heart murmur    Neuromuscular disorder (HCC)     Current Outpatient Medications:    acetaminophen  (TYLENOL ) 650 MG CR tablet, Take 650-1,300 mg by mouth every 8 (eight) hours as needed for pain., Disp: , Rfl:    apixaban  (ELIQUIS ) 2.5 MG TABS tablet, Take 1 tablet (2.5 mg total) by mouth 2 (two) times daily. (Patient not taking: Reported on 06/29/2024), Disp: 30 tablet, Rfl: 0   atorvastatin (LIPITOR) 10 MG tablet, Take 10 mg by mouth 3 (three) times a week. Tuesday, Thursdays & Saturdays., Disp: , Rfl:    B Complex-C (B-COMPLEX WITH VITAMIN C) tablet, Take 1 tablet by mouth in the morning., Disp: , Rfl:    Cholecalciferol (VITAMIN D3) 50 MCG (2000 UT) TABS, Take 2,000 Units by mouth in the morning. I'm not taking it with the blood thinners., Disp: , Rfl:    fluticasone (FLONASE) 50 MCG/ACT nasal spray, Place 1 spray into both nostrils at bedtime as needed for allergies (nasal congestion.)., Disp: , Rfl:    gabapentin  (NEURONTIN ) 300 MG capsule, Take 1 capsule (300 mg total) by mouth 2 (two) times daily., Disp: 60 capsule, Rfl: 5   meloxicam (MOBIC) 15 MG tablet, Take 15 mg by mouth daily., Disp: , Rfl:    methocarbamol  (ROBAXIN ) 500 MG tablet, TAKE 1 & 1/2 TABLETS BY MOUTH UP TO 2 TIMES DAILY., Disp: 90 tablet, Rfl: 3   Multiple Vitamin (MULTIVITAMIN WITH MINERALS) TABS tablet, Take 1 tablet by mouth in the  morning., Disp: , Rfl:    ondansetron  (ZOFRAN ) 8 MG tablet, Take 8 mg by mouth daily as needed for nausea or vomiting., Disp: , Rfl:   Social History   Tobacco Use  Smoking Status Former   Current packs/day: 0.00   Types: Cigarettes   Start date: 53   Quit date: 1978   Years since quitting: 47.9  Smokeless Tobacco Never    Allergies  Allergen Reactions   Penicillin G Anaphylaxis   Aspirin Other (See Comments)    syncope   Codeine Other (See Comments)    unknown   Prednisone Other (See Comments)    headaches   Sulfa Antibiotics Hives   Objective:  There were no vitals filed for this visit. There is no height or weight on file to calculate BMI. Constitutional Well developed. Well nourished.  Vascular Dorsalis pedis pulses palpable bilaterally. Posterior tibial pulses palpable bilaterally. Capillary refill normal to all digits.  No cyanosis or clubbing noted. Pedal hair growth normal.  Neurologic Normal speech. Oriented to person, place, and time. Epicritic sensation to light touch grossly present bilaterally.  Dermatologic Nails well groomed and normal in appearance. No open wounds. No skin lesions.  Orthopedic: Right second metatarsal phalangeal joint pain mild pain with range of motion negative extensor flexor tendinitis noted.  No deep intra-articular pain noted.  She is  Radiographs: None Assessment:   1. Synovitis of right foot     Plan:  Patient was evaluated and treated and all questions answered.  Right second metatarsophalangeal joint synovitis - All questions or concerns were discussed with the patient in extensive detail - She states that the injection did not help much.  She wants to discuss next treatment plan.  She did not get any relief with the steroid shot.  At this time she would benefit from cam boot immobilization I discussed this with the patient she states understanding like proceed with cam boot immobilization - Cam boot was  dispensed   No follow-ups on file.

## 2024-09-27 ENCOUNTER — Other Ambulatory Visit: Payer: Self-pay

## 2024-09-27 ENCOUNTER — Telehealth: Payer: Self-pay | Admitting: Neurology

## 2024-09-27 MED ORDER — METHOCARBAMOL 500 MG PO TABS: ORAL_TABLET | ORAL | 3 refills | Status: AC

## 2024-09-27 NOTE — Telephone Encounter (Signed)
 Patient request medication refill for methocarbamol  (ROBAXIN ) 500 MG tablet send to  [facility] [phone]

## 2024-09-27 NOTE — Telephone Encounter (Signed)
 Refills sent into pharmacy,

## 2024-09-28 ENCOUNTER — Ambulatory Visit: Admitting: Orthopedic Surgery

## 2024-10-06 ENCOUNTER — Ambulatory Visit

## 2024-10-06 ENCOUNTER — Ambulatory Visit: Admitting: Podiatry

## 2024-10-06 DIAGNOSIS — M2011 Hallux valgus (acquired), right foot: Secondary | ICD-10-CM

## 2024-10-06 DIAGNOSIS — Z01818 Encounter for other preprocedural examination: Secondary | ICD-10-CM | POA: Diagnosis not present

## 2024-10-06 DIAGNOSIS — M65971 Unspecified synovitis and tenosynovitis, right ankle and foot: Secondary | ICD-10-CM

## 2024-10-06 NOTE — Patient Instructions (Signed)

## 2024-10-06 NOTE — Progress Notes (Signed)
 "  Subjective:  Patient ID: Shawna Suarez, female    DOB: Mar 10, 1961,  MRN: [id number]  Chief Complaint  Patient presents with   Foot Pain    Pt stated that he is still having pain     64 y.o. female presents with the above complaint.  Patient presents with complaint of right severe bunion deformity that is painful to touch is progressive gotten worse worse with ambulation or certain pressure.  She has received injections in the past which has helped some but continues to be bothersome.  At this time she has failed shoe gear modification padding protecting offloading all conservative treatment options she wishes to undergo surgical intervention for bunion surgery.  Pain scale 7 out of 10 dull aching nature  Review of Systems: Negative except as noted in the HPI. Denies N/V/F/Ch.  Past Medical History:  Diagnosis Date   Arthritis    Heart murmur    Neuromuscular disorder (HCC)    Current Medications[1]  Tobacco Use History[2]  Allergies[3] Objective:  There were no vitals filed for this visit. There is no height or weight on file to calculate BMI. Constitutional Well developed. Well nourished.  Vascular Dorsalis pedis pulses palpable bilaterally. Posterior tibial pulses palpable bilaterally. Capillary refill normal to all digits.  No cyanosis or clubbing noted. Pedal hair growth normal.  Neurologic Normal speech. Oriented to person, place, and time. Epicritic sensation to light touch grossly present bilaterally.  Dermatologic Nails well groomed and normal in appearance. No open wounds. No skin lesions.  Orthopedic: Normal joint ROM without pain or crepitus bilaterally. Hallux abductovalgus deformity present right severe.  Pain on palpation this is track bound not a tracking deformity no intra-articular first MPJ pain noted no crepitus noted good range of motion noted at the first MPJ Left 1st MPJ diminished range of motion. Left 1st TMT without gross hypermobility. Right 1st  MPJ diminished range of motion  Right 1st TMT with gross hypermobility. Lesser digital contractures absent bilaterally.   Radiographs: Taken and reviewed. Hallux abductovalgus deformity present. Metatarsal parabola normal. 1st/2nd IMA: Severe; TSP: 6 out of 7.  Some arthritic changes noted at the right second metatarsophalangeal joint.  This could likely be due to the increased IM angle.  Assessment:   1. Hallux abducto valgus, right   2. Encounter for preoperative examination for general surgical procedure    Plan:  Patient was evaluated and treated and all questions answered.  Hallux abductovalgus deformity, right -XR as above. -Patient has failed all conservative therapy and wishes to proceed with surgical intervention. All risks, benefits, and alternatives discussed with patient. No guarantees given. Consent reviewed and signed by patient. Post-op course explained at length. -Planned procedures: Minimally invasive bunion surgery with metatarsal osteotomy and fixation with possible phalangeal osteotomy - I discussed my preoperative postoperative plan with the patient in extensive detail she states understanding would like to proceed with surgery. -Informed surgical risk consent was reviewed and read aloud to the patient.  I reviewed the films.  I have discussed my findings with the patient in great detail.  I have discussed all risks including but not limited to infection, stiffness, scarring, limp, disability, deformity, damage to blood vessels and nerves, numbness, poor healing, need for braces, arthritis, chronic pain, amputation, death.  All benefits and realistic expectations discussed in great detail.  I have made no promises as to the outcome.  I have provided realistic expectations.  I have offered the patient a 2nd opinion, which they have declined  and assured me they preferred to proceed despite the risks   No follow-ups on file.     [1]  Current Outpatient Medications:     acetaminophen  (TYLENOL ) 650 MG CR tablet, Take 650-1,300 mg by mouth every 8 (eight) hours as needed for pain., Disp: , Rfl:    apixaban  (ELIQUIS ) 2.5 MG TABS tablet, Take 1 tablet (2.5 mg total) by mouth 2 (two) times daily. (Patient not taking: Reported on 06/29/2024), Disp: 30 tablet, Rfl: 0   atorvastatin (LIPITOR) 10 MG tablet, Take 10 mg by mouth 3 (three) times a week. Tuesday, Thursdays & Saturdays., Disp: , Rfl:    B Complex-C (B-COMPLEX WITH VITAMIN C) tablet, Take 1 tablet by mouth in the morning., Disp: , Rfl:    Cholecalciferol (VITAMIN D3) 50 MCG (2000 UT) TABS, Take 2,000 Units by mouth in the morning. I'm not taking it with the blood thinners., Disp: , Rfl:    fluticasone (FLONASE) 50 MCG/ACT nasal spray, Place 1 spray into both nostrils at bedtime as needed for allergies (nasal congestion.)., Disp: , Rfl:    gabapentin  (NEURONTIN ) 300 MG capsule, Take 1 capsule (300 mg total) by mouth 2 (two) times daily., Disp: 60 capsule, Rfl: 5   meloxicam (MOBIC) 15 MG tablet, Take 15 mg by mouth daily., Disp: , Rfl:    methocarbamol  (ROBAXIN ) 500 MG tablet, TAKE 1 & 1/2 TABLETS BY MOUTH UP TO 2 TIMES DAILY., Disp: 90 tablet, Rfl: 3   Multiple Vitamin (MULTIVITAMIN WITH MINERALS) TABS tablet, Take 1 tablet by mouth in the morning., Disp: , Rfl:    ondansetron  (ZOFRAN ) 8 MG tablet, Take 8 mg by mouth daily as needed for nausea or vomiting., Disp: , Rfl:  [2]  Social History Tobacco Use  Smoking Status Former   Current packs/day: 0.00   Types: Cigarettes   Start date: 78   Quit date: 1978   Years since quitting: 48.0  Smokeless Tobacco Never  [3]  Allergies Allergen Reactions   Penicillin G Anaphylaxis   Aspirin Other (See Comments)    syncope   Codeine Other (See Comments)    unknown   Prednisone Other (See Comments)    headaches   Sulfa Antibiotics Hives   "

## 2024-10-07 ENCOUNTER — Telehealth: Payer: Self-pay | Admitting: Lab

## 2024-10-07 ENCOUNTER — Telehealth: Payer: Self-pay | Admitting: Podiatry

## 2024-10-07 NOTE — Telephone Encounter (Signed)
 Patient states no steroid was called in for her can you review and advise.

## 2024-10-07 NOTE — Telephone Encounter (Signed)
 Patient called to request medication be sent to [facility] on [address] in [place]. Pharmacy has not received order.

## 2024-10-11 ENCOUNTER — Telehealth: Payer: Self-pay | Admitting: Neurology

## 2024-10-11 NOTE — Telephone Encounter (Signed)
 Patient updating insurance information: [employer] Member ID: [id number] Group# [id number]# [id number] PCN# [id number]

## 2024-10-12 ENCOUNTER — Other Ambulatory Visit: Payer: Self-pay | Admitting: Podiatry

## 2024-10-12 MED ORDER — METHYLPREDNISOLONE 4 MG PO TBPK: ORAL_TABLET | ORAL | 0 refills | Status: AC

## 2025-06-27 ENCOUNTER — Ambulatory Visit: Admitting: Neurology
